# Patient Record
Sex: Male | Born: 1995
Health system: Southern US, Community
[De-identification: ages and names within clinical notes are randomized; demographics above are authoritative.]

---
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
## 2015-01-02 ENCOUNTER — Telehealth: Payer: Self-pay | Admitting: Family Medicine

## 2015-01-02 NOTE — Telephone Encounter (Signed)
appt scheduled

## 2015-01-03 ENCOUNTER — Ambulatory Visit (INDEPENDENT_AMBULATORY_CARE_PROVIDER_SITE_OTHER): Payer: No Typology Code available for payment source | Admitting: Family Medicine

## 2015-01-03 VITALS — BP 132/72 | HR 61 | Temp 97.5°F | Ht 76.6 in | Wt 275.0 lb

## 2015-01-03 DIAGNOSIS — L209 Atopic dermatitis, unspecified: Secondary | ICD-10-CM

## 2015-01-03 DIAGNOSIS — L739 Follicular disorder, unspecified: Secondary | ICD-10-CM

## 2015-01-03 MED ORDER — METHYLPREDNISOLONE ACETATE 80 MG/ML IJ SUSP
60.0000 mg | Freq: Once | INTRAMUSCULAR | Status: AC
  Administered 2015-01-03: 60 mg via INTRAMUSCULAR

## 2015-01-03 MED ORDER — DOXYCYCLINE HYCLATE 100 MG PO TABS: 100.0000 mg | ORAL_TABLET | Freq: Two times a day (BID) | ORAL | Status: DC

## 2015-01-03 MED ORDER — PREDNISONE 10 MG PO TABS: ORAL_TABLET | ORAL | Status: DC

## 2015-01-03 NOTE — Patient Instructions (Signed)
Take medications as directed Use scent free fabric softener soaps and detergents If worse or no better in a couple weeks please call back

## 2015-01-03 NOTE — Progress Notes (Signed)
   Subjective:    Patient ID: Travis Lyons, male    DOB: 1996-10-10, 19 y.o.   MRN: [id number]  HPI  There are no active problems to display for this patient.  Outpatient Encounter Prescriptions as of 01/03/2015  Medication Sig  . sertraline (ZOLOFT) 50 MG tablet Take 1 tablet (50 mg total) by mouth daily.  Marland Kitchen doxycycline (VIBRA-TABS) 100 MG tablet Take 1 tablet (100 mg total) by mouth 2 (two) times daily.  . predniSONE (DELTASONE) 10 MG tablet 1 tablet 4 times a day for 2 days,  1 tablet 3 times a day for 2 days,  1 tablet 2 times a day for 2 days, 1 tablet daily for 2 days  . [DISCONTINUED] amoxicillin (AMOXIL) 875 MG tablet Take 1 tablet (875 mg total) by mouth 2 (two) times daily.      Review of Systems     Objective:   Physical Exam   BP 132/72 mmHg  Pulse 61  Temp(Src) 97.5 F (36.4 C) (Oral)  Ht 6' 4.6" (1.946 m)  Wt 275 lb (124.739 kg)  BMI 32.94 kg/m2      Assessment & Plan:

## 2015-01-03 NOTE — Progress Notes (Signed)
   Subjective:    Patient ID: Travis Lyons, male    DOB: 06/30/1996, 19 y.o.   MRN: [id number]  HPI Agent comes in today with a rash that is worse on his back and is now getting worse on his arms. This is been going on for 3-4 months and since football season. It does not itch.   Review of Systems  Skin: Positive for rash. Negative for color change, pallor and wound.  All other systems reviewed and are negative.      Objective:   Physical Exam  Constitutional: He appears well-developed and well-nourished. No distress.  The patient comes to the visit today with his mom.  Skin: Skin is warm and dry. Rash noted. No erythema. No pallor.  This is a follicular type rash being worse on his backside and several areas on both arms and a few have tiny pustules in them.  Psychiatric: He has a normal mood and affect. His behavior is normal. Judgment and thought content normal.  Nursing note and vitals reviewed.         Assessment & Plan:  1. Folliculitis - doxycycline (VIBRA-TABS) 100 MG tablet; Take 1 tablet (100 mg total) by mouth 2 (two) times daily.  Dispense: 20 tablet; Refill: 0 - predniSONE (DELTASONE) 10 MG tablet; 1 tablet 4 times a day for 2 days,  1 tablet 3 times a day for 2 days,  1 tablet 2 times a day for 2 days, 1 tablet daily for 2 days  Dispense: 20 tablet; Refill: 0 - methylPREDNISolone acetate (DEPO-MEDROL) injection 60 mg; Inject 0.75 mLs (60 mg total) into the muscle once.  2. Atopic dermatitis  Patient Instructions  Take medications as directed Use scent free fabric softener soaps and detergents If worse or no better in a couple weeks please call back   Arrie Senate MD

## 2015-01-09 ENCOUNTER — Telehealth: Payer: Self-pay | Admitting: Family

## 2015-01-09 NOTE — Telephone Encounter (Signed)
Is it ok for patient to have note for school for today for stomach virus

## 2015-01-09 NOTE — Telephone Encounter (Signed)
Note made. Mother aware and sent

## 2015-01-09 NOTE — Telephone Encounter (Signed)
Please fax mom a note for school. Only for today!

## 2015-02-16 ENCOUNTER — Ambulatory Visit: Payer: No Typology Code available for payment source | Admitting: Family

## 2015-12-04 ENCOUNTER — Encounter: Payer: Self-pay | Admitting: Family Medicine

## 2015-12-04 ENCOUNTER — Telehealth: Payer: Self-pay | Admitting: Family Medicine

## 2015-12-04 ENCOUNTER — Ambulatory Visit (INDEPENDENT_AMBULATORY_CARE_PROVIDER_SITE_OTHER): Payer: BLUE CROSS/BLUE SHIELD | Admitting: Family Medicine

## 2015-12-04 VITALS — BP 140/79 | HR 80 | Temp 97.6°F | Ht 76.0 in | Wt 290.0 lb

## 2015-12-04 DIAGNOSIS — K529 Noninfective gastroenteritis and colitis, unspecified: Secondary | ICD-10-CM

## 2015-12-04 MED ORDER — ONDANSETRON 8 MG PO TBDP: 8.0000 mg | ORAL_TABLET | Freq: Four times a day (QID) | ORAL | Status: DC | PRN

## 2015-12-04 NOTE — Progress Notes (Signed)
Subjective:  Patient ID: KAIEA LEWEY, male    DOB: 09/30/1996  Age: 20 y.o. MRN: [id number]  CC: Nausea; Emesis; and Diarrhea   HPI TREYVONN EDGELL presents for Onset yesterday after lunch of nausea then vomiting. There were several episodes through the afternoon. Tried to eat, but vomitied   History Kage has no past medical history on file.   He has no past surgical history on file.   His family history is not on file.He reports that he has never smoked. His smokeless tobacco use includes Chew. He reports that he does not drink alcohol or use illicit drugs.    ROS Review of Systems  Constitutional: Negative for fever, chills, diaphoresis and unexpected weight change.  HENT: Negative for rhinorrhea and trouble swallowing.   Respiratory: Negative for cough, chest tightness and shortness of breath.   Cardiovascular: Negative for chest pain.  Gastrointestinal: Positive for nausea, vomiting, abdominal pain and diarrhea. Negative for constipation, blood in stool, abdominal distention and rectal pain.  Genitourinary: Negative for dysuria, hematuria and flank pain.  Musculoskeletal: Negative for joint swelling and arthralgias.  Skin: Negative for rash.  Neurological: Negative for syncope and headaches.    Objective:  BP 140/79 mmHg  Pulse 80  Temp(Src) 97.6 F (36.4 C) (Oral)  Ht 6\' 4"  (1.93 m)  Wt 290 lb (131.543 kg)  BMI 35.31 kg/m2  BP Readings from Last 3 Encounters:  12/04/15 140/79  01/03/15 132/72  07/24/14 125/67    Wt Readings from Last 3 Encounters:  12/04/15 290 lb (131.543 kg) (100 %*, Z = 2.94)  01/03/15 275 lb (124.739 kg) (100 %*, Z = 2.78)  07/24/14 263 lb (119.296 kg) (100 %*, Z = 2.67)   * Growth percentiles are based on CDC 2-20 Years data.     Physical Exam  Constitutional: He appears well-developed and well-nourished.  HENT:  Head: Normocephalic and atraumatic.  Right Ear: Tympanic membrane and external ear normal. No decreased hearing is  noted.  Left Ear: Tympanic membrane and external ear normal. No decreased hearing is noted.  Mouth/Throat: No oropharyngeal exudate or posterior oropharyngeal erythema.  Eyes: Pupils are equal, round, and reactive to light.  Neck: Normal range of motion. Neck supple.  Cardiovascular: Normal rate and regular rhythm.   No murmur heard. Pulmonary/Chest: Breath sounds normal. No respiratory distress.  Abdominal: Soft. Bowel sounds are normal. He exhibits no mass. There is no tenderness.  Vitals reviewed.    No results found for: WBC, HGB, HCT, PLT, GLUCOSE, CHOL, TRIG, HDL, LDLDIRECT, LDLCALC, ALT, AST, NA, K, CL, CREATININE, BUN, CO2, TSH, PSA, INR, GLUF, HGBA1C, MICROALBUR  No results found.  Assessment & Plan:   Chuong was seen today for nausea, emesis and diarrhea.  Diagnoses and all orders for this visit:  Gastroenteritis  Other orders -     ondansetron (ZOFRAN-ODT) 8 MG disintegrating tablet; Take 1 tablet (8 mg total) by mouth every 6 (six) hours as needed for nausea or vomiting.      Use OTC Immodium AD as needed for diarrhea  Clear liquids Today:  Clear Liquid Diet A clear liquid diet is a short-term diet that is prescribed to provide the necessary fluid and basic energy you need when you can have nothing else. The clear liquid diet consists of liquids or solids that will become liquid at room temperature. You should be able to see through the liquid. There are many reasons that you may be restricted to clear liquids, such as:  When you have a sudden-onset (acute) condition that occurs before or after surgery.  To help your body slowly get adjusted to food again after a long period when you were unable to have food.  Replacement of fluids when you have a diarrheal disease.  When you are going to have certain exams, such as a colonoscopy, in which instruments are inserted inside your body to look at parts of your digestive system. WHAT CAN I HAVE? A clear liquid diet  does not provide all the nutrients you need. It is important to choose a variety of the following items to get as many nutrients as possible:  Vegetable juices that do not have pulp.  Fruit juices and fruit drinks that do not have pulp.  Coffee (regular or decaffeinated), tea, or soda at the discretion of your health care provider.  Clear bouillon, broth, or strained broth-based soups.  High-protein and flavored gelatins.  Sugar or honey.  Ices or frozen ice pops that do not contain milk. If you are not sure whether you can have certain items, you should ask your health care provider. You may also ask your health care provider if there are any other clear liquid options.   Full Liquids Saturday:  Full Liquid Diet A full liquid diet may be used:   To help you transition from a clear liquid diet to a soft diet.   When your body is healing and can only tolerate foods that are easy to digest.  Before or after certain a procedure, test, or surgery (such as stomach or intestinal surgeries).   If you have trouble swallowing or chewing.  A full liquid diet includes fluids and foods that are liquid or will become liquid at room temperature. The full liquid diet gives you the proteins, fluids, salts, and minerals that you need for energy. If you continue this diet for more than 72 hours, talk to your health care provider about how many calories you need to consume. If you continue the diet for more than 5 days, talk to your health care provider about taking a multivitamin or a nutritional supplement. WHAT DO I NEED TO KNOW ABOUT A FULL LIQUID DIET?  You may have any liquid.  You may have any food that becomes a liquid at room temperature. The food is considered a liquid if it can be poured off a spoon at room temperature.  Drink one serving of citrus or vitamin C-enriched fruit juice daily. WHAT FOODS CAN I EAT? Grains Any grain food that can be pureed in soup (such as crackers,  pasta, and rice). Hot cereal (such as farina or oatmeal) that has been blended. Talk to your health care provider or dietitian about these foods. Vegetables Pulp-free tomato or vegetable juice. Vegetables pureed in soup.  Fruits Fruit juice, including nectars and juices with pulp. Meats and Other Protein Sources Eggs in custard, eggnog mix, and eggs used in ice cream or pudding. Strained meats, like in baby food, may be allowed. Consult your health care provider.  Dairy Milk and milk-based beverages, including milk shakes and instant breakfast mixes. Smooth yogurt. Pureed cottage cheese. Avoid these foods if they are not well tolerated. Beverages All beverages, including liquid nutritional supplements. Ask your health care provider if you can have carbonated beverages. They may not be well tolerated. Condiments Iodized salt, pepper, spices, and flavorings. Cocoa powder. Vinegar, ketchup, yellow mustard, smooth sauces (such as hollandaise, cheese sauce, or white sauce), and soy sauce. Sweets and Desserts Custard, smooth  pudding. Flavored gelatin. Tapioca, junket. Plain ice cream, sherbet, fruit ices. Frozen ice pops, frozen fudge pops, pudding pops, and other frozen bars with cream. Syrups, including chocolate syrup. Sugar, honey, jelly.  Fats and Oils Margarine, butter, cream, sour cream, and oils. Other Broth and cream soups. Strained, broth-based soups. The items listed above may not be a complete list of recommended foods or beverages. Contact your dietitian for more options.  WHAT FOODS CAN I NOT EAT? Grains All breads. Grains are not allowed unless they are pureed into soup. Vegetables Vegetables are not allowed unless they are juiced, or cooked and pureed into soup. Fruits Fruits are not allowed unless they are juiced. Meats and Other Protein Sources Any meat or fish. Cooked or raw eggs. Nut butters.  Dairy Cheese.  Condiments Stone ground mustards. Fats and Oils Fats that are  coarse or chunky. Sweets and Desserts Ice cream or other frozen desserts that have any solids in them or on top, such as nuts, chocolate chips, and pieces of cookies. Cakes. Cookies. Candy. Others Soups with chunks or pieces in them.  Normal diet starting Sunday, if symptoms are better    Follow-up: Return if symptoms worsen or fail to improve.  Claretta Fraise, M.D.

## 2015-12-04 NOTE — Patient Instructions (Addendum)
Use OTC Immodium AD as needed for diarrhea  Clear liquids Today:  Clear Liquid Diet A clear liquid diet is a short-term diet that is prescribed to provide the necessary fluid and basic energy you need when you can have nothing else. The clear liquid diet consists of liquids or solids that will become liquid at room temperature. You should be able to see through the liquid. There are many reasons that you may be restricted to clear liquids, such as:  When you have a sudden-onset (acute) condition that occurs before or after surgery.  To help your body slowly get adjusted to food again after a long period when you were unable to have food.  Replacement of fluids when you have a diarrheal disease.  When you are going to have certain exams, such as a colonoscopy, in which instruments are inserted inside your body to look at parts of your digestive system. WHAT CAN I HAVE? A clear liquid diet does not provide all the nutrients you need. It is important to choose a variety of the following items to get as many nutrients as possible:  Vegetable juices that do not have pulp.  Fruit juices and fruit drinks that do not have pulp.  Coffee (regular or decaffeinated), tea, or soda at the discretion of your health care provider.  Clear bouillon, broth, or strained broth-based soups.  High-protein and flavored gelatins.  Sugar or honey.  Ices or frozen ice pops that do not contain milk. If you are not sure whether you can have certain items, you should ask your health care provider. You may also ask your health care provider if there are any other clear liquid options.   Full Liquids Saturday:  Full Liquid Diet A full liquid diet may be used:   To help you transition from a clear liquid diet to a soft diet.   When your body is healing and can only tolerate foods that are easy to digest.  Before or after certain a procedure, test, or surgery (such as stomach or intestinal surgeries).    If you have trouble swallowing or chewing.  A full liquid diet includes fluids and foods that are liquid or will become liquid at room temperature. The full liquid diet gives you the proteins, fluids, salts, and minerals that you need for energy. If you continue this diet for more than 72 hours, talk to your health care provider about how many calories you need to consume. If you continue the diet for more than 5 days, talk to your health care provider about taking a multivitamin or a nutritional supplement. WHAT DO I NEED TO KNOW ABOUT A FULL LIQUID DIET?  You may have any liquid.  You may have any food that becomes a liquid at room temperature. The food is considered a liquid if it can be poured off a spoon at room temperature.  Drink one serving of citrus or vitamin C-enriched fruit juice daily. WHAT FOODS CAN I EAT? Grains Any grain food that can be pureed in soup (such as crackers, pasta, and rice). Hot cereal (such as farina or oatmeal) that has been blended. Talk to your health care provider or dietitian about these foods. Vegetables Pulp-free tomato or vegetable juice. Vegetables pureed in soup.  Fruits Fruit juice, including nectars and juices with pulp. Meats and Other Protein Sources Eggs in custard, eggnog mix, and eggs used in ice cream or pudding. Strained meats, like in baby food, may be allowed. Consult your health care provider.  Dairy Milk and milk-based beverages, including milk shakes and instant breakfast mixes. Smooth yogurt. Pureed cottage cheese. Avoid these foods if they are not well tolerated. Beverages All beverages, including liquid nutritional supplements. Ask your health care provider if you can have carbonated beverages. They may not be well tolerated. Condiments Iodized salt, pepper, spices, and flavorings. Cocoa powder. Vinegar, ketchup, yellow mustard, smooth sauces (such as hollandaise, cheese sauce, or white sauce), and soy sauce. Sweets and  Desserts Custard, smooth pudding. Flavored gelatin. Tapioca, junket. Plain ice cream, sherbet, fruit ices. Frozen ice pops, frozen fudge pops, pudding pops, and other frozen bars with cream. Syrups, including chocolate syrup. Sugar, honey, jelly.  Fats and Oils Margarine, butter, cream, sour cream, and oils. Other Broth and cream soups. Strained, broth-based soups. The items listed above may not be a complete list of recommended foods or beverages. Contact your dietitian for more options.  WHAT FOODS CAN I NOT EAT? Grains All breads. Grains are not allowed unless they are pureed into soup. Vegetables Vegetables are not allowed unless they are juiced, or cooked and pureed into soup. Fruits Fruits are not allowed unless they are juiced. Meats and Other Protein Sources Any meat or fish. Cooked or raw eggs. Nut butters.  Dairy Cheese.  Condiments Stone ground mustards. Fats and Oils Fats that are coarse or chunky. Sweets and Desserts Ice cream or other frozen desserts that have any solids in them or on top, such as nuts, chocolate chips, and pieces of cookies. Cakes. Cookies. Candy. Others Soups with chunks or pieces in them.  Normal diet starting Sunday, if symptoms are better

## 2015-12-04 NOTE — Telephone Encounter (Signed)
Patient just needed note refaxed.

## 2016-04-22 ENCOUNTER — Encounter: Payer: Self-pay | Admitting: Family Medicine

## 2016-04-22 ENCOUNTER — Ambulatory Visit (INDEPENDENT_AMBULATORY_CARE_PROVIDER_SITE_OTHER): Payer: BLUE CROSS/BLUE SHIELD | Admitting: Family Medicine

## 2016-04-22 VITALS — BP 130/81 | HR 81 | Temp 98.4°F | Ht 76.0 in | Wt 308.0 lb

## 2016-04-22 DIAGNOSIS — S39012A Strain of muscle, fascia and tendon of lower back, initial encounter: Secondary | ICD-10-CM

## 2016-04-22 MED ORDER — CYCLOBENZAPRINE HCL 10 MG PO TABS: 10.0000 mg | ORAL_TABLET | Freq: Three times a day (TID) | ORAL | Status: DC | PRN

## 2016-04-22 NOTE — Progress Notes (Signed)
BP 130/81 mmHg  Pulse 81  Temp(Src) 98.4 F (36.9 C) (Oral)  Ht 6\' 4"  (1.93 m)  Wt 308 lb (139.708 kg)  BMI 37.51 kg/m2   Subjective:    Patient ID: Travis Lyons, male    DOB: [birth year], 20 y.o.   MRN: [id number]  HPI: Travis Lyons is a 20 y.o. male presenting on 04/22/2016 for Back Pain   HPI Low back pain Patient comes in today with low back pain worse on the left than the right. This been going on for 2 weeks. He has been trying to use ibuprofen to deal with it. 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 He denies any numbness or weakness or loss of bowel or bladder. He denies any loss of range of motion. The pain is in a bandlike area across his low back and is described as an achy and tightness.  Relevant past medical, surgical, family and social history reviewed and updated as indicated. Interim medical history since our last visit reviewed. Allergies and medications reviewed and updated.  Review of Systems  Constitutional: Negative for fever.  HENT: Negative for ear discharge and ear pain.   Eyes: Negative for discharge and visual disturbance.  Respiratory: Negative for shortness of breath and wheezing.   Cardiovascular: Negative for chest pain and leg swelling.  Gastrointestinal: Negative for abdominal pain, diarrhea and constipation.  Genitourinary: Negative for difficulty urinating.  Musculoskeletal: Positive for myalgias and back pain. Negative for gait problem.  Skin: Negative for rash.  Neurological: Negative for dizziness, syncope, weakness, light-headedness, numbness and headaches.  All other systems reviewed and are negative.   Per HPI unless specifically indicated above     Medication List       This list is accurate as of: 04/22/16  3:43 PM.  Always use your most recent med list.               cyclobenzaprine 10 MG tablet  Commonly known as:  FLEXERIL  Take 1 tablet (10 mg total) by mouth 3 (three) times daily  as needed for muscle spasms.     ibuprofen 400 MG tablet  Commonly known as:  ADVIL,MOTRIN  Take 400 mg by mouth as needed.           Objective:    BP 130/81 mmHg  Pulse 81  Temp(Src) 98.4 F (36.9 C) (Oral)  Ht 6\' 4"  (1.93 m)  Wt 308 lb (139.708 kg)  BMI 37.51 kg/m2  Wt Readings from Last 3 Encounters:  04/22/16 308 lb (139.708 kg) (100 %*, Z = 3.15)  12/04/15 290 lb (131.543 kg) (100 %*, Z = 2.94)  01/03/15 275 lb (124.739 kg) (100 %*, Z = 2.78)   * Growth percentiles are based on CDC 2-20 Years data.    Physical Exam  Constitutional: He is oriented to person, place, and time. He appears well-developed and well-nourished. No distress.  Eyes: Conjunctivae and EOM are normal. Pupils are equal, round, and reactive to light. Right eye exhibits no discharge. No scleral icterus.  Neck: Neck supple. No thyromegaly present.  Cardiovascular: Normal rate, regular rhythm, normal heart sounds and intact distal pulses.   No murmur heard. Pulmonary/Chest: Effort normal and breath sounds normal. No respiratory distress. He has no wheezes.  Musculoskeletal: Normal range of motion. He exhibits tenderness (low back pain, worse on right than left, negative SLR b/l). He exhibits no edema.  Lymphadenopathy:    He  has no cervical adenopathy.  Neurological: He is alert and oriented to person, place, and time. Coordination normal.  Skin: Skin is warm and dry. No rash noted. He is not diaphoretic.  Psychiatric: He has a normal mood and affect. His behavior is normal.  Nursing note and vitals reviewed.     Assessment & Plan:   Problem List Items Addressed This Visit    None    Visit Diagnoses    Lumbar strain, initial encounter    -  Primary    Worse on left than right, no radicular symptoms, do anti-inflammatories and heat, stretches, tennis ball massage    Relevant Medications    cyclobenzaprine (FLEXERIL) 10 MG tablet        Follow up plan: Return if symptoms worsen or fail to  improve.  Counseling provided for all of the vaccine components No orders of the defined types were placed in this encounter.    Caryl Pina, MD [facility] 04/22/2016, 3:43 PM

## 2016-12-21 ENCOUNTER — Encounter: Payer: Self-pay | Admitting: Family Medicine

## 2016-12-21 ENCOUNTER — Ambulatory Visit (INDEPENDENT_AMBULATORY_CARE_PROVIDER_SITE_OTHER): Payer: BLUE CROSS/BLUE SHIELD | Admitting: Family Medicine

## 2016-12-21 VITALS — BP 140/82 | HR 78 | Temp 98.9°F | Ht 76.0 in | Wt 302.0 lb

## 2016-12-21 DIAGNOSIS — L239 Allergic contact dermatitis, unspecified cause: Secondary | ICD-10-CM | POA: Diagnosis not present

## 2016-12-21 MED ORDER — BETAMETHASONE SOD PHOS & ACET 6 (3-3) MG/ML IJ SUSP
6.0000 mg | Freq: Once | INTRAMUSCULAR | Status: AC
  Administered 2016-12-21: 6 mg via INTRAMUSCULAR

## 2016-12-21 NOTE — Progress Notes (Signed)
   Subjective:  Patient ID: Travis Lyons, male    DOB: Jan 21, 1996  Age: 21 y.o. MRN: [id number]  CC: Rash   HPI ARYAV STOECKEL presents for Onset yesterday of pruritic rash located at the arms and legs. Started actually at the left ankle. Very pruritic scratched at it quite a bit. Spreading all over his body today. No fever chills sweats or respiratory symptoms. No systemic signs such as dyspnea tongue or throat swelling jittery and anxious feeling palpitations or chest pain.  History Garvis has no past medical history on file.   He has no past surgical history on file.   His family history is not on file.He reports that he has never smoked. His smokeless tobacco use includes Chew. He reports that he does not drink alcohol or use drugs.  Current Outpatient Prescriptions on File Prior to Visit  Medication Sig Dispense Refill  . cyclobenzaprine (FLEXERIL) 10 MG tablet Take 1 tablet (10 mg total) by mouth 3 (three) times daily as needed for muscle spasms. 30 tablet 0  . ibuprofen (ADVIL,MOTRIN) 400 MG tablet Take 400 mg by mouth as needed.     No current facility-administered medications on file prior to visit.     ROS Review of Systems  Constitutional: Negative for chills, diaphoresis and fever.  HENT: Negative for rhinorrhea and sore throat.   Respiratory: Negative for cough and shortness of breath.   Cardiovascular: Negative for chest pain.  Gastrointestinal: Negative for abdominal pain.  Musculoskeletal: Negative for arthralgias and myalgias.  Skin: Positive for rash (see history of present illness).  Neurological: Negative for weakness and headaches.    Objective:  BP 140/82   Pulse 78   Temp 98.9 F (37.2 C) (Oral)   Ht 6\' 4"  (1.93 m)   Wt (!) 302 lb (137 kg)   BMI 36.76 kg/m   Physical Exam  Constitutional: He is oriented to person, place, and time. He appears well-developed and well-nourished.  HENT:  Head: Normocephalic and atraumatic.  Right Ear: External ear  normal.  Left Ear: External ear normal.  Mouth/Throat: No oropharyngeal exudate or posterior oropharyngeal erythema.  Eyes: Pupils are equal, round, and reactive to light.  Neck: Normal range of motion. Neck supple.  Cardiovascular: Normal rate and regular rhythm.   No murmur heard. Pulmonary/Chest: Breath sounds normal. No respiratory distress.  Neurological: He is alert and oriented to person, place, and time.  Skin: Rash noted. There is erythema.  Maculopapular eruption covering much of the dorsal forearms. Goes up to the shoulder. None on the back or head. Also at the lower left leg and ankle and onto the dorsal foot. The palms and soles are spared. Genitalia spared.  Psychiatric: He has a normal mood and affect.  Vitals reviewed.   Assessment & Plan:   Esteven was seen today for rash.  Diagnoses and all orders for this visit:  Eczema, allergic -     betamethasone acetate-betamethasone sodium phosphate (CELESTONE) injection 6 mg; Inject 1 mL (6 mg total) into the muscle once.   I am having Mr. Slaski maintain his ibuprofen and cyclobenzaprine. We will continue to administer betamethasone acetate-betamethasone sodium phosphate.  Meds ordered this encounter  Medications  . betamethasone acetate-betamethasone sodium phosphate (CELESTONE) injection 6 mg     Follow-up: No Follow-up on file.  Claretta Fraise, M.D.

## 2018-11-03 ENCOUNTER — Emergency Department (HOSPITAL_COMMUNITY)
Admission: EM | Admit: 2018-11-03 | Discharge: 2018-11-04 | Disposition: A | Discharge status: Home / Self Care | Payer: BLUE CROSS/BLUE SHIELD | Attending: Emergency Medicine | Admitting: Emergency Medicine

## 2018-11-03 ENCOUNTER — Other Ambulatory Visit: Payer: Self-pay

## 2018-11-03 ENCOUNTER — Encounter (HOSPITAL_COMMUNITY): Payer: Self-pay | Admitting: Emergency Medicine

## 2018-11-03 DIAGNOSIS — R072 Precordial pain: Secondary | ICD-10-CM | POA: Insufficient documentation

## 2018-11-03 DIAGNOSIS — F17220 Nicotine dependence, chewing tobacco, uncomplicated: Secondary | ICD-10-CM | POA: Insufficient documentation

## 2018-11-03 DIAGNOSIS — R079 Chest pain, unspecified: Secondary | ICD-10-CM | POA: Diagnosis present

## 2018-11-03 DIAGNOSIS — R091 Pleurisy: Secondary | ICD-10-CM | POA: Diagnosis not present

## 2018-11-03 NOTE — ED Triage Notes (Signed)
Pt woke up with chest pain that he rated 5/10. States pain started in his back and radiated around to L. Side of chest. Pt describes pain as sharp in nature and worse with inspiration. EMS gave pt 324 ASA and 1sl NTG en route. Pt now rates pain as 4/10.

## 2018-11-04 ENCOUNTER — Other Ambulatory Visit: Payer: Self-pay

## 2018-11-04 ENCOUNTER — Emergency Department (HOSPITAL_COMMUNITY): Payer: BLUE CROSS/BLUE SHIELD

## 2018-11-04 LAB — CBC WITH DIFFERENTIAL/PLATELET
ABS IMMATURE GRANULOCYTES: 0.04 10*3/uL (ref 0.00–0.07)
BASOS ABS: 0.1 10*3/uL (ref 0.0–0.1)
BASOS PCT: 1 %
Eosinophils Absolute: 0.4 10*3/uL (ref 0.0–0.5)
Eosinophils Relative: 3 %
HCT: 43.2 % (ref 39.0–52.0)
Hemoglobin: 14 g/dL (ref 13.0–17.0)
IMMATURE GRANULOCYTES: 0 %
LYMPHS ABS: 3 10*3/uL (ref 0.7–4.0)
Lymphocytes Relative: 25 %
MCH: 28.9 pg (ref 26.0–34.0)
MCHC: 32.4 g/dL (ref 30.0–36.0)
MCV: 89.1 fL (ref 80.0–100.0)
MONOS PCT: 9 %
Monocytes Absolute: 1.1 10*3/uL — ABNORMAL HIGH (ref 0.1–1.0)
NEUTROS ABS: 7.4 10*3/uL (ref 1.7–7.7)
NRBC: 0 % (ref 0.0–0.2)
Neutrophils Relative %: 62 %
PLATELETS: 290 10*3/uL (ref 150–400)
RBC: 4.85 MIL/uL (ref 4.22–5.81)
RDW: 12.9 % (ref 11.5–15.5)
WBC: 12 10*3/uL — ABNORMAL HIGH (ref 4.0–10.5)

## 2018-11-04 LAB — BASIC METABOLIC PANEL
ANION GAP: 8 (ref 5–15)
BUN: 20 mg/dL (ref 6–20)
CO2: 25 mmol/L (ref 22–32)
Calcium: 9.4 mg/dL (ref 8.9–10.3)
Chloride: 107 mmol/L (ref 98–111)
Creatinine, Ser: 1.09 mg/dL (ref 0.61–1.24)
GFR calc Af Amer: >60 mL/min (ref >60)
GFR calc non Af Amer: >60 mL/min (ref >60)
GLUCOSE: 109 mg/dL — AB (ref 70–99)
POTASSIUM: 3.6 mmol/L (ref 3.5–5.1)
Sodium: 140 mmol/L (ref 135–145)

## 2018-11-04 LAB — D-DIMER, QUANTITATIVE: D-Dimer, Quant: 0.29 ug/mL-FEU (ref 0.00–0.50)

## 2018-11-04 NOTE — Discharge Instructions (Signed)

## 2018-11-04 NOTE — ED Provider Notes (Signed)
[facility] EMERGENCY DEPARTMENT Provider Note   CSN: [id number] Arrival date & time: 11/03/18  2350     History   Chief Complaint Chief Complaint  Patient presents with  . Chest Pain    HPI Travis Lyons is a 22 y.o. male.  The history is provided by the patient.  Chest Pain   This is a new problem. The current episode started 6 to 12 hours ago. The problem occurs hourly. The problem has been gradually worsening. The pain is associated with breathing. The pain is present in the lateral region. The pain is moderate. The quality of the pain is described as sharp. The pain radiates to the upper back. The symptoms are aggravated by deep breathing. Pertinent negatives include no abdominal pain, no cough, no fever, no hemoptysis, no shortness of breath and no vomiting. He has tried nitroglycerin (ASA) for the symptoms. The treatment provided moderate relief.  Patient reports since the morning he is having left-sided chest pain.  It seems to go from his chest to his back with deep breathing.  He has no other significant symptoms.  No recent illnesses in the past 1 to 2 weeks.  He does report recent stress and  death of a friend over the holidays    PMH-none Soc hx - smokeless tobacco vaped yesterday (tobacco) No drug use No travel Home Medications    Prior to Admission medications   Medication Sig Start Date End Date Taking? Authorizing Provider  ibuprofen (ADVIL,MOTRIN) 400 MG tablet Take 400 mg by mouth as needed.   Yes [provider]  cyclobenzaprine (FLEXERIL) 10 MG tablet Take 1 tablet (10 mg total) by mouth 3 (three) times daily as needed for muscle spasms. 04/22/16   Dettinger, Fransisca Kaufmann, MD    Family History No family history on file.  Social History Social History   Tobacco Use  . Smoking status: Never Smoker  . Smokeless tobacco: Current User    Types: Chew  Substance Use Topics  . Alcohol use: No  . Drug use: No     Allergies   Cefzil  [cefprozil]   Review of Systems Review of Systems  Constitutional: Negative for fever.  Respiratory: Negative for cough, hemoptysis and shortness of breath.   Cardiovascular: Positive for chest pain.  Gastrointestinal: Negative for abdominal pain and vomiting.  All other systems reviewed and are negative.    Physical Exam Updated Vital Signs BP (!) 143/70 (BP Location: Right Arm)   Pulse 81   Temp 98.4 F (36.9 C) (Oral)   Resp 13   SpO2 96%   Physical Exam CONSTITUTIONAL: Well developed/well nourished HEAD: Normocephalic/atraumatic EYES: EOMI/PERRL ENMT: Mucous membranes moist NECK: supple no meningeal signs SPINE/BACK:entire spine nontender CV: S1/S2 noted, no murmurs/rubs/gallops noted LUNGS: Lungs are clear to auscultation bilaterally, no apparent distress Chest-no tenderness or bruising ABDOMEN: soft, nontender, no rebound or guarding, bowel sounds noted throughout abdomen GU:no cva tenderness NEURO: Pt is awake/alert/appropriate, moves all extremitiesx4.  No facial droop.   EXTREMITIES: pulses normal/equal, full ROM, no calf tenderness SKIN: warm, color normal PSYCH: no abnormalities of mood noted, alert and oriented to situation   ED Treatments / Results  Labs (all labs ordered are listed, but only abnormal results are displayed) Labs Reviewed  BASIC METABOLIC PANEL - Abnormal; Notable for the following components:      Result Value   Glucose, Bld 109 (*)    All other components within normal limits  CBC WITH DIFFERENTIAL/PLATELET - Abnormal; Notable  for the following components:   WBC 12.0 (*)    Monocytes Absolute 1.1 (*)    All other components within normal limits  D-DIMER, QUANTITATIVE (NOT AT [facility])    EKG EKG Interpretation  Date/Time:  Saturday November 03 2018 23:54:35 EST Ventricular Rate:  105 PR Interval:    QRS Duration: 88 QT Interval:  320 QTC Calculation: 423 R Axis:   72 Text Interpretation:  Sinus tachycardia Nonspecific T  abnormalities, lateral leads No previous ECGs available Confirmed by Ripley Fraise [phone]) on 11/03/2018 11:59:04 PM   EKG Interpretation  Date/Time:  Sunday November 04 2018 01:14:42 EST Ventricular Rate:  90 PR Interval:    QRS Duration: 91 QT Interval:  341 QTC Calculation: 418 R Axis:   65 Text Interpretation:  Sinus rhythm Borderline T wave abnormalities Borderline ST elevation, anterior leads Confirmed by Ripley Fraise [phone]) on 11/04/2018 1:23:04 AM       Radiology Dg Chest 2 View  Result Date: 11/04/2018 CLINICAL DATA:  Acute onset of intermittent midsternal chest pain. EXAM: CHEST - 2 VIEW COMPARISON:  None. FINDINGS: The lungs are well-aerated and clear. There is no evidence of focal opacification, pleural effusion or pneumothorax. The heart is normal in size; the mediastinal contour is within normal limits. No acute osseous abnormalities are seen. IMPRESSION: No acute cardiopulmonary process seen. Electronically Signed   By: Garald Balding M.D.   On: 11/04/2018 01:01    Procedures Procedures  Medications Ordered in ED Medications - No data to display   Initial Impression / Assessment and Plan / ED Course  I have reviewed the triage vital signs and the nursing notes.  Pertinent labs & imaging results that were available during my care of the patient were reviewed by me and considered in my medical decision making (see chart for details).     Patient presents with onset of pleuritic type chest pain for several hours.  He is now improved.  No hypoxia noted, chest x-ray is negative.  Labs are reassuring. PE has been ruled out.  Differential includes pericarditis versus pleurisy, though no strong EKG findings to support pericarditis.  Patient is very well-appearing, smiling and in no acute distress. My suspicion for acute cardiopulmonary emergency is very low.  We discussed return precautions.  Also advised use anti-inflammatories.  Needs PCP follow-up in 1 week if  no improvement Discussed care with patient and his mother with his permission, patient agreeable plan Final Clinical Impressions(s) / ED Diagnoses   Final diagnoses:  Precordial pain  Pleurisy    ED Discharge Orders    None       Ripley Fraise, MD 11/04/18 [phone]

## 2018-12-12 ENCOUNTER — Encounter: Payer: Self-pay | Admitting: Physician Assistant

## 2018-12-12 ENCOUNTER — Other Ambulatory Visit: Payer: Self-pay

## 2018-12-12 ENCOUNTER — Ambulatory Visit (INDEPENDENT_AMBULATORY_CARE_PROVIDER_SITE_OTHER): Payer: No Typology Code available for payment source | Admitting: Physician Assistant

## 2018-12-12 VITALS — BP 123/76 | HR 79 | Temp 97.8°F | Ht 77.0 in | Wt 323.0 lb

## 2018-12-12 DIAGNOSIS — D229 Melanocytic nevi, unspecified: Secondary | ICD-10-CM

## 2018-12-12 NOTE — Progress Notes (Signed)
BP 123/76   Pulse 79   Temp 97.8 F (36.6 C) (Oral)   Ht 6\' 5"  (1.956 m)   Wt (!) 323 lb (146.5 kg)   BMI 38.30 kg/m    Subjective:    Patient ID: Travis Lyons, male    DOB: 03-30-1996, 23 y.o.   MRN: [id number]  HPI: Travis Lyons is a 23 y.o. male presenting on 12/12/2018 for Nevus (on head)  This patient comes in for an abnormal nevus on the left scalp.  Over the past 2 months when he is got his haircut it has gotten hit and will bleed for a little bit.  He can get it under control.  It does feel sore to the touch.  States he has had it for some time but it just has grown significantly in the last few months.  He denies any fever or chills.  He does not have any other lesions like this on his body.  History reviewed. No pertinent past medical history. Relevant past medical, surgical, family and social history reviewed and updated as indicated. Interim medical history since our last visit reviewed. Allergies and medications reviewed and updated. DATA REVIEWED: CHART IN EPIC  Family History reviewed for pertinent findings.  Review of Systems  Constitutional: Negative.  Negative for appetite change and fatigue.  Eyes: Negative for pain and visual disturbance.  Respiratory: Negative.  Negative for cough, chest tightness, shortness of breath and wheezing.   Cardiovascular: Negative.  Negative for chest pain, palpitations and leg swelling.  Gastrointestinal: Negative.  Negative for abdominal pain, diarrhea, nausea and vomiting.  Genitourinary: Negative.   Skin: Negative.  Negative for color change and rash.  Neurological: Negative.  Negative for weakness, numbness and headaches.  Psychiatric/Behavioral: Negative.     Allergies as of 12/12/2018      Reactions   Cefzil [cefprozil]       Medication List       Accurate as of December 12, 2018 11:44 AM. Always use your most recent med list.        ibuprofen 400 MG tablet Commonly known as:  ADVIL,MOTRIN Take 400 mg by mouth as  needed.          Objective:    BP 123/76   Pulse 79   Temp 97.8 F (36.6 C) (Oral)   Ht 6\' 5"  (1.956 m)   Wt (!) 323 lb (146.5 kg)   BMI 38.30 kg/m   Allergies  Allergen Reactions  . Cefzil [Cefprozil]     Wt Readings from Last 3 Encounters:  12/12/18 (!) 323 lb (146.5 kg)  11/04/18 300 lb (136.1 kg)  12/21/16 (!) 302 lb (137 kg)    Physical Exam Vitals signs and nursing note reviewed.  Constitutional:      General: He is not in acute distress.    Appearance: He is well-developed.  HENT:     Head: Normocephalic and atraumatic.      Comments: Skin color raised papule, hair growth, slightly rough Eyes:     Conjunctiva/sclera: Conjunctivae normal.     Pupils: Pupils are equal, round, and reactive to light.  Cardiovascular:     Rate and Rhythm: Normal rate and regular rhythm.     Heart sounds: Normal heart sounds.  Pulmonary:     Effort: Pulmonary effort is normal. No respiratory distress.     Breath sounds: Normal breath sounds.  Skin:    General: Skin is warm and dry.  Psychiatric:  Behavior: Behavior normal.         Assessment & Plan:   1. Nevus of scalp irritated Dermatology appointment this afternoon [employer]. Prior auth needed  Continue all other maintenance medications as listed above.  Follow up plan: No follow-ups on file.  Educational handout given for [place] PA-C [facility] [address]  [place], [place] [address] [phone]   12/12/2018, 11:44 AM

## 2018-12-17 ENCOUNTER — Ambulatory Visit: Payer: BLUE CROSS/BLUE SHIELD | Admitting: Family Medicine

## 2018-12-26 ENCOUNTER — Ambulatory Visit: Payer: BLUE CROSS/BLUE SHIELD | Admitting: Physician Assistant

## 2019-01-01 ENCOUNTER — Ambulatory Visit (INDEPENDENT_AMBULATORY_CARE_PROVIDER_SITE_OTHER): Payer: No Typology Code available for payment source | Admitting: Family Medicine

## 2019-01-01 ENCOUNTER — Encounter: Payer: Self-pay | Admitting: Family Medicine

## 2019-01-01 VITALS — BP 154/90 | HR 97 | Temp 99.0°F | Ht 77.0 in | Wt 318.0 lb

## 2019-01-01 DIAGNOSIS — J351 Hypertrophy of tonsils: Secondary | ICD-10-CM

## 2019-01-01 DIAGNOSIS — J029 Acute pharyngitis, unspecified: Secondary | ICD-10-CM | POA: Diagnosis not present

## 2019-01-01 LAB — RAPID STREP SCREEN (MED CTR MEBANE ONLY): Strep Gp A Ag, IA W/Reflex: NEGATIVE

## 2019-01-01 LAB — CULTURE, GROUP A STREP

## 2019-01-01 MED ORDER — PREDNISONE 20 MG PO TABS: 20.0000 mg | ORAL_TABLET | Freq: Every day | ORAL | 0 refills | Status: AC

## 2019-01-01 NOTE — Progress Notes (Signed)
    Travis Lyons is a 23 y.o. male presenting with a sore throat for 2 days.  Associated symptoms include:  chills.  Symptoms are constant.  Home treatment thus far includes:  lozenges and OTC sore throat / cold products. No relief with treatments.   No known sick contacts with similar symptoms.  There is no history of of similar symptoms.  History, medications, and allergies reviewed.   Review of Systems  Constitutional: Positive for chills and fever. Negative for malaise/fatigue.  HENT: Positive for sore throat.   Respiratory: Negative for cough, hemoptysis, sputum production, shortness of breath and wheezing.   Cardiovascular: Negative for chest pain and palpitations.  Gastrointestinal: Negative for abdominal pain, nausea and vomiting.  Neurological: Negative for dizziness and headaches.  All other systems reviewed and are negative.    Exam:  BP (!) 154/90   Pulse 97   Temp 99 F (37.2 C) (Oral)   Ht 6\' 5"  (1.956 m)   Wt (!) 318 lb (144.2 kg)   BMI 37.71 kg/m  Physical Exam  Constitutional: He is oriented to person, place, and time. He appears not dehydrated. He appears healthy.  Non-toxic appearance. He does not have a sickly appearance. He appears distressed (mild).  HENT:  Head: Normocephalic and atraumatic.  Mouth/Throat: Uvula is midline and mucous membranes are normal. Oropharyngeal exudate, posterior oropharyngeal edema and posterior oropharyngeal erythema present. No tonsillar abscesses.  Bilateral tonsils 3+  Eyes: Pupils are equal, round, and reactive to light. Conjunctivae are normal.  Neck: Normal range of motion and phonation normal. Neck supple.  Cardiovascular: Normal rate, regular rhythm and normal heart sounds.  Abdominal: Soft. Bowel sounds are normal. He exhibits no distension. There is no hepatosplenomegaly. There is no abdominal tenderness. There is no CVA tenderness.  Lymphadenopathy:    He has no cervical adenopathy.  Neurological: He is alert  and oriented to person, place, and time.  Skin: Skin is warm and dry. No rash noted. No erythema. No pallor.  Psychiatric: Mood, memory, affect and judgment normal.   Rapid strep negative in office  Assessment and Plan  Travis Lyons was seen today for sore throat.  Diagnoses and all orders for this visit:  Sore throat Rapid strep negative. Labs pending. Symptomatic care discussed.  -     Rapid Strep Screen ([facility] ONLY) -     Mononucleosis Test, Qual W/ Reflex  Tonsillar hypertrophy Symptomatic care discussed. Medications as prescribed. Report any new or worsening symptoms.  -     predniSONE (DELTASONE) 20 MG tablet; Take 1 tablet (20 mg total) by mouth daily with breakfast for 5 days.  Return if symptoms worsen or fail to improve.  The above assessment and management plan was discussed with the patient. The patient verbalized understanding of and has agreed to the management plan. Patient is aware to call the clinic if symptoms fail to improve or worsen. Patient is aware when to return to the clinic for a follow-up visit. Patient educated on when it is appropriate to go to the emergency department.   Monia Pouch, FNP-C [facility] [address] [place], [place] [address] [phone]

## 2019-01-01 NOTE — Patient Instructions (Signed)
Sore Throat  When you have a sore throat, your throat may feel:  · Tender.  · Burning.  · Irritated.  · Scratchy.  · Painful when you swallow.  · Painful when you talk.  Many things can cause a sore throat, such as:  · An infection.  · Allergies.  · Dry air.  · Smoke or pollution.  · Radiation treatment.  · Gastroesophageal reflux disease (GERD).  · A tumor.  A sore throat can be the first sign of another sickness. It can happen with other problems, like:  · Coughing.  · Sneezing.  · Fever.  · Swelling in the neck.  Most sore throats go away without treatment.  Follow these instructions at home:         · Take over-the-counter medicines only as told by your doctor.  ? If your child has a sore throat, do not give your child aspirin.  · Drink enough fluids to keep your pee (urine) pale yellow.  · Rest when you feel you need to.  · To help with pain:  ? Sip warm liquids, such as broth, herbal tea, or warm water.  ? Eat or drink cold or frozen liquids, such as frozen ice pops.  ? Gargle with a salt-water mixture 3-4 times a day or as needed. To make a salt-water mixture, add ½-1 tsp (3-6 g) of salt to 1 cup (237 mL) of warm water. Mix it until you cannot see the salt anymore.  ? Suck on hard candy or throat lozenges.  ? Put a cool-mist humidifier in your bedroom at night.  ? Sit in the bathroom with the door closed for 5-10 minutes while you run hot water in the shower.  · Do not use any products that contain nicotine or tobacco, such as cigarettes, e-cigarettes, and chewing tobacco. If you need help quitting, ask your doctor.  · Wash your hands well and often with soap and water. If soap and water are not available, use hand sanitizer.  Contact a doctor if:  · You have a fever for more than 2-3 days.  · You keep having symptoms for more than 2-3 days.  · Your throat does not get better in 7 days.  · You have a fever and your symptoms suddenly get worse.  · Your child who is 3 months to 3 years old has a temperature of  102.2°F (39°C) or higher.  Get help right away if:  · You have trouble breathing.  · You cannot swallow fluids, soft foods, or your saliva.  · You have swelling in your throat or neck that gets worse.  · You keep feeling sick to your stomach (nauseous).  · You keep throwing up (vomiting).  Summary  · A sore throat is pain, burning, irritation, or scratchiness in the throat. Many things can cause a sore throat.  · Take over-the-counter medicines only as told by your doctor. Do not give your child aspirin.  · Drink plenty of fluids, and rest as needed.  · Contact a doctor if your symptoms get worse or your sore throat does not get better within 7 days.  This information is not intended to replace advice given to you by your health care provider. Make sure you discuss any questions you have with your health care provider.  Document Released: 08/02/2008 Document Revised: 03/26/2018 Document Reviewed: 03/26/2018  Elsevier Interactive Patient Education © 2019 [employer]

## 2019-01-02 LAB — MONO QUAL W/RFLX QN: MONO QUAL W/RFLX QN: NEGATIVE

## 2019-11-19 ENCOUNTER — Encounter: Payer: Self-pay | Admitting: Family

## 2019-11-19 ENCOUNTER — Other Ambulatory Visit: Payer: Self-pay

## 2019-11-19 ENCOUNTER — Ambulatory Visit: Payer: BC Managed Care – PPO | Admitting: Family

## 2019-11-19 VITALS — BP 145/95 | HR 102 | Temp 99.1°F | Ht 77.0 in | Wt 310.6 lb

## 2019-11-19 DIAGNOSIS — Z202 Contact with and (suspected) exposure to infections with a predominantly sexual mode of transmission: Secondary | ICD-10-CM | POA: Diagnosis not present

## 2019-11-19 DIAGNOSIS — R300 Dysuria: Secondary | ICD-10-CM | POA: Diagnosis not present

## 2019-11-19 LAB — URINALYSIS, COMPLETE
Bilirubin, UA: NEGATIVE
Glucose, UA: NEGATIVE
Ketones, UA: NEGATIVE
Leukocytes,UA: NEGATIVE
Nitrite, UA: NEGATIVE
Protein,UA: NEGATIVE
RBC, UA: NEGATIVE
Specific Gravity, UA: 1.025 (ref 1.005–1.030)
Urobilinogen, Ur: 0.2 mg/dL (ref 0.2–1.0)
pH, UA: 7 (ref 5.0–7.5)

## 2019-11-19 LAB — MICROSCOPIC EXAMINATION
Bacteria, UA: NONE SEEN
Epithelial Cells (non renal): NONE SEEN /hpf (ref 0–10)
Renal Epithel, UA: NONE SEEN /hpf
WBC, UA: NONE SEEN /hpf (ref 0–5)

## 2019-11-19 MED ORDER — CEFTRIAXONE SODIUM 1 G IJ SOLR: 500.0000 mg | Freq: Once | INTRAMUSCULAR | Status: DC

## 2019-11-19 MED ORDER — CEFTRIAXONE SODIUM 500 MG IJ SOLR
500.0000 mg | Freq: Once | INTRAMUSCULAR | Status: AC
  Administered 2019-11-19: 15:00:00 500 mg via INTRAMUSCULAR

## 2019-11-19 MED ORDER — AZITHROMYCIN 500 MG PO TABS: 1000.0000 mg | ORAL_TABLET | Freq: Every day | ORAL | 0 refills | Status: DC

## 2019-11-19 NOTE — Patient Instructions (Signed)
Chlamydia, Male    Chlamydia is an STD (sexually transmitted disease). It is a bacterial infection that spreads through sexual contact (is contagious). Chlamydia can occur in different areas of the body, including the tube that moves urine from the bladder out of the body (urethra), the throat, or the rectum. This condition is not difficult to treat. However, if left untreated, chlamydia can lead to more serious health problems.  What are the causes?  Chlamydia is caused by the bacteria Chlamydia trachomatis. It is passed from an infected partner during sexual activity. Chlamydia can spread through contact with the genitals, mouth, or rectum.  What are the signs or symptoms?  In some cases, there may not be any symptoms for this condition (asymptomatic), especially early in the infection. If symptoms develop, they may include:  · Burning when urinating.  · Urinating frequently.  · Pain or swelling in the testicles.  · Watery, mucus-like discharge from the penis.  · Redness, soreness, and swelling (inflammation) of the rectum.  · Bleeding or discharge from the rectum.  · Abdominal pain.  · Itching, burning, or redness in the eyes, or discharge from the eyes.  How is this diagnosed?  This condition may be diagnosed based on:  · Urine tests.  · Swab tests. Depending on your symptoms, your health care provider may use a cotton swab to collect discharge from your urethra or rectum to test for the bacteria.  How is this treated?  This condition is treated with antibiotic medicines.  Follow these instructions at home:  Medicines  · Take over-the-counter and prescription medicines only as told by your health care provider.  · Take your antibiotic medicine as told by your health care provider. Do not stop taking the antibiotic even if you start to feel better.  Sexual activity  · Tell sexual partners about your infection. This includes any oral, anal, or vaginal sex partners you have had within 60 days of when your symptoms  started. Sexual partners should also be treated, even if they have no signs of the disease.  · Do not have sex until you and your sexual partners have completed treatment and your health care provider says it is okay. If your health care provider prescribed you a single dose treatment, wait 7 days after taking the treatment before having sex.  General instructions  · It is your responsibility to get your test results. Ask your health care provider, or the department performing the test, when your results will be ready.  · Get plenty of rest.  · Eat a healthy, well-balanced diet.  · Drink enough fluids to keep your urine clear or pale yellow.  · Keep all follow-up visits as told by your health care provider. This is important. You may need to be tested for infection again 3 months after treatment.  How is this prevented?  The only sure way to prevent chlamydia is to avoid sexual intercourse. However, you can lower your risk by:  · Using latex condoms correctly every time you have sexual intercourse.  · Not having multiple sexual partners.  · Asking if your sexual partner has been tested for STIs and had negative results.  Contact a health care provider if:  · You develop new symptoms or your symptoms do not get better after completing treatment.  · You have a fever or chills.  · You have pain during sexual intercourse.  · You develop new joint pain or swelling near your joints.  · You   Chlamydia is an STD (sexually transmitted disease). It is a bacterial infection that spreads (is contagious) through sexual contact.  This condition is not difficult to treat, however, if left untreated, it can lead to more serious health problems.  In some cases, there may not  be any symptoms for this condition (asymptomatic).  This condition is treated with antibiotic medicines.  Using latex condoms correctly every time you have sexual intercourse can help prevent chlamydia. This information is not intended to replace advice given to you by your health care provider. Make sure you discuss any questions you have with your health care provider. Document Revised: 11/08/2017 Document Reviewed: 10/10/2016 Elsevier Patient Education  [place] [facility] Practicing safe sex means taking steps before and during sex to reduce your risk of:  Getting an STI (sexually transmitted infection).  Giving your partner an STI.  Unwanted or unplanned pregnancy. How can I practice safe sex?     Ways you can practice safe sex  Limit your sexual partners to only one partner who is having sex with only you.  Avoid using alcohol and drugs before having sex. Alcohol and drugs can affect your judgment.  Before having sex with a new partner: ? Talk to your partner about past partners, past STIs, and drug use. ? Get screened for STIs and discuss the results with your partner. Ask your partner to get screened, too.  Check your body regularly for sores, blisters, rashes, or unusual discharge. If you notice any of these problems, visit your health care provider.  Avoid sexual contact if you have symptoms of an infection or you are being treated for an STI.  While having sex, use a condom. Make sure to: ? Use a condom every time you have vaginal, oral, or anal sex. Both females and males should wear condoms during oral sex. ? Keep condoms in place from the beginning to the end of sexual activity. ? Use a latex condom, if possible. Latex condoms offer the best protection. ? Use only water-based lubricants with a condom. Using petroleum-based lubricants or oils will weaken the condom and increase the chance that it will break. Ways your health care provider can help you  practice safe sex  See your health care provider for regular screenings, exams, and tests for STIs.  Talk with your health care provider about what kind of birth control (contraception) is best for you.  Get vaccinated against hepatitis B and human papillomavirus (HPV).  If you are at risk of being infected with HIV (human immunodeficiency virus), talk with your health care provider about taking a prescription medicine to prevent HIV infection. You are at risk for HIV if you: ? Are a man who has sex with other men. ? Are sexually active with more than one partner. ? Take drugs by injection. ? Have a sex partner who has HIV. ? Have unprotected sex. ? Have sex with someone who has sex with both men and women. ? Have had an STI. Follow these instructions at home:  Take over-the-counter and prescription medicines as told by your health care provider.  Keep all follow-up visits as told by your health care provider. This is important. Where to find more information  Centers for Disease Control and Prevention: PinkCheek.be  Planned Parenthood: https://www.plannedparenthood.org/  Office on Women's Health: BasketballVoice.it Summary  Practicing safe sex means taking steps before and during sex to reduce your risk of STIs, giving your partner STIs, and having an unwanted or unplanned pregnancy.  Before having sex with a new partner, talk to your partner about past partners, past STIs, and drug use.  Use a condom every time you have vaginal, oral, or anal sex. Both females and males should wear condoms during oral sex.  Check your body regularly for sores, blisters, rashes, or unusual discharge. If you notice any of these problems, visit your health care provider.  See your health care provider for regular screenings, exams, and tests for STIs. This information is not intended to replace advice given to  you by your health care provider. Make sure you discuss any questions you have with your health care provider. Document Revised: 02/15/2019 Document Reviewed: 08/06/2018 Elsevier Patient Education  [place].

## 2019-11-19 NOTE — Addendum Note (Signed)
Addended by: Karle Plumber on: 11/19/2019 03:13 PM   Modules accepted: Orders

## 2019-11-19 NOTE — Progress Notes (Signed)
Subjective:    Patient ID: Travis Lyons, male    DOB: Sep 16, 1996, 24 y.o.   MRN: [id number]  Chief Complaint  Patient presents with  . std check  . Dysuria    x 1 week   Pt presents to the office today with dysuria that started about a week. He does report a new sex parnter about two weeks ago.  Dysuria  This is a new problem. The current episode started 1 to 4 weeks ago. The problem occurs every urination. The problem has been gradually improving. The quality of the pain is described as burning. The pain is at a severity of 5/10. The pain is mild. There has been no fever. He is sexually active. Associated symptoms include a discharge (clear discharge 3-4 days ago, but not better. ) and frequency. Pertinent negatives include no flank pain, hematuria, hesitancy, nausea, urgency or vomiting. He has tried nothing for the symptoms. The treatment provided no relief.      Review of Systems  Gastrointestinal: Negative for nausea and vomiting.  Genitourinary: Positive for dysuria and frequency. Negative for flank pain, hematuria, hesitancy and urgency.  All other systems reviewed and are negative.      Objective:   Physical Exam Vitals reviewed.  Constitutional:      General: He is not in acute distress.    Appearance: He is well-developed.  HENT:     Head: Normocephalic.     Right Ear: Tympanic membrane normal.     Left Ear: Tympanic membrane normal.  Eyes:     General:        Right eye: No discharge.        Left eye: No discharge.     Pupils: Pupils are equal, round, and reactive to light.  Neck:     Thyroid: No thyromegaly.  Cardiovascular:     Rate and Rhythm: Normal rate and regular rhythm.     Heart sounds: Normal heart sounds. No murmur.  Pulmonary:     Effort: Pulmonary effort is normal. No respiratory distress.     Breath sounds: Normal breath sounds. No wheezing.  Abdominal:     General: Bowel sounds are normal. There is no distension.     Palpations: Abdomen is  soft.     Tenderness: There is no abdominal tenderness.  Musculoskeletal:        General: No tenderness. Normal range of motion.     Cervical back: Normal range of motion and neck supple.  Skin:    General: Skin is warm and dry.     Findings: No erythema or rash.  Neurological:     Mental Status: He is alert and oriented to person, place, and time.     Cranial Nerves: No cranial nerve deficit.     Deep Tendon Reflexes: Reflexes are normal and symmetric.  Psychiatric:        Behavior: Behavior normal.        Thought Content: Thought content normal.        Judgment: Judgment normal.     BP (!) 145/95   Pulse (!) 102   Temp 99.1 F (37.3 C) (Temporal)   Ht 6\' 5"  (1.956 m)   Wt (!) 310 lb 9.6 oz (140.9 kg)   SpO2 98%   BMI 36.83 kg/m        Assessment & Plan:  Travis Lyons comes in today with chief complaint of std check and Dysuria (x 1 week)   Diagnosis and orders  addressed:  1. Dysuria - urinalysis- dip and micro  2. Exposure to STD Safe sex discussed No sex for next 7 days Will go ahead and treat, labs pending. If positive will need to be retested - STD Screen (8) - C. trachomatis/N. gonorrhoeae RNA - azithromycin (ZITHROMAX) 500 MG tablet; Take 2 tablets (1,000 mg total) by mouth daily.  Dispense: 2 tablet; Refill: 0 - cefTRIAXone (ROCEPHIN) injection 500 mg  Evelina Dun, FNP

## 2019-11-20 LAB — STD SCREEN (8)
HIV Screen 4th Generation wRfx: NONREACTIVE
HSV 1 Glycoprotein G Ab, IgG: <0.91 index (ref 0.00–0.90)
HSV 2 IgG, Type Spec: <0.91 index (ref 0.00–0.90)
Hep A IgM: NEGATIVE
Hep B C IgM: NEGATIVE
Hep C Virus Ab: <0.1 s/co ratio (ref 0.0–0.9)
Hepatitis B Surface Ag: NEGATIVE
RPR Ser Ql: NONREACTIVE

## 2020-01-03 ENCOUNTER — Telehealth: Payer: BLUE CROSS/BLUE SHIELD | Admitting: Nurse Practitioner

## 2020-01-03 ENCOUNTER — Telehealth: Payer: Self-pay | Admitting: Nurse Practitioner

## 2020-01-03 DIAGNOSIS — J029 Acute pharyngitis, unspecified: Secondary | ICD-10-CM

## 2020-01-03 MED ORDER — AMOXICILLIN 500 MG PO CAPS: 500.0000 mg | ORAL_CAPSULE | Freq: Three times a day (TID) | ORAL | 0 refills | Status: DC

## 2020-01-03 NOTE — Progress Notes (Signed)

## 2020-01-03 NOTE — Progress Notes (Signed)
We are sorry that you are not feeling well.  Here is how we plan to help!  * this is your second evisit today. Did you mean to resubmit?  Your symptoms indicate a likely viral infection (Pharyngitis).   Pharyngitis is inflammation in the back of the throat which can cause a sore throat, scratchiness and sometimes difficulty swallowing.   Pharyngitis is typically caused by a respiratory virus and will just run its course.  Please keep in mind that your symptoms could last up to 10 days.  For throat pain, we recommend over the counter oral pain relief medications such as acetaminophen or aspirin, or anti-inflammatory medications such as ibuprofen or naproxen sodium.  Topical treatments such as oral throat lozenges or sprays may be used as needed.  Avoid close contact with loved ones, especially the very young and elderly.  Remember to wash your hands thoroughly throughout the day as this is the number one way to prevent the spread of infection and wipe down door knobs and counters with disinfectant.  After careful review of your answers, I would not recommend and antibiotic for your condition.  Antibiotics should not be used to treat conditions that we suspect are caused by viruses like the virus that causes the common cold or flu. However, some people can have Strep with atypical symptoms. You may need formal testing in clinic or office to confirm if your symptoms continue or worsen.  Providers prescribe antibiotics to treat infections caused by bacteria. Antibiotics are very powerful in treating bacterial infections when they are used properly.  To maintain their effectiveness, they should be used only when necessary.  Overuse of antibiotics has resulted in the development of super bugs that are resistant to treatment!    Home Care:  Only take medications as instructed by your medical team.  Do not drink alcohol while taking these medications.  A steam or ultrasonic humidifier can help congestion.   You can place a towel over your head and breathe in the steam from hot water coming from a faucet.  Avoid close contacts especially the very young and the elderly.  Cover your mouth when you cough or sneeze.  Always remember to wash your hands.  Get Help Right Away If:  You develop worsening fever or throat pain.  You develop a severe head ache or visual changes.  Your symptoms persist after you have completed your treatment plan.  Make sure you  Understand these instructions.  Will watch your condition.  Will get help right away if you are not doing well or get worse.  Your e-visit answers were reviewed by a board certified advanced clinical practitioner to complete your personal care plan.  Depending on the condition, your plan could have included both over the counter or prescription medications.  If there is a problem please reply  once you have received a response from your provider.  Your safety is important to [place].  If you have drug allergies check your prescription carefully.    You can use MyChart to ask questions about todays visit, request a non-urgent call back, or ask for a work or school excuse for 24 hours related to this e-Visit. If it has been greater than 24 hours you will need to follow up with your provider, or enter a new e-Visit to address those concerns.  You will get an e-mail in the next two days asking about your experience.  I hope that your e-visit has been valuable and will speed  your recovery. Thank you for using e-visits.      5-10 minutes spent reviewing and documenting in chart.

## 2020-01-05 ENCOUNTER — Telehealth (INDEPENDENT_AMBULATORY_CARE_PROVIDER_SITE_OTHER): Payer: Self-pay | Admitting: Family Medicine

## 2020-01-05 DIAGNOSIS — J029 Acute pharyngitis, unspecified: Secondary | ICD-10-CM

## 2020-01-05 MED ORDER — PREDNISONE 10 MG (21) PO TBPK: ORAL_TABLET | ORAL | 0 refills | Status: DC

## 2020-01-05 MED ORDER — MAGIC MOUTHWASH W/LIDOCAINE: ORAL | 0 refills | Status: DC

## 2020-01-05 NOTE — Telephone Encounter (Signed)
Telephone visit  Subjective: CC: sore throat, decreased appetite PCP: Terald Sleeper, PA-C [id number] R Ramsour is a 24 y.o. male calls for telephone consult today. Patient provides verbal consent for consult held via phone.  Due to COVID-19 pandemic this visit was conducted virtually. This visit type was conducted due to national recommendations for restrictions regarding the COVID-19 Pandemic (e.g. social distancing, sheltering in place) in an effort to limit this patient's exposure and mitigate transmission in our community. All issues noted in this document were discussed and addressed.  A physical exam was not performed with this format.   Location of patient: home Location of provider: Working remotely from home Others present for call: mom  1. Sore throat Mother calls to report that patient has ongoing sore throat that seems to be more severe on the right side. Has had difficulty swallowing soft foods. She cannot palpate any enlarged lymph nodes. He has had no recurrent fever. He had a low-grade fever last night to 99.6 F and this is with use of alternating Tylenol and Motrin. He does note that the pain on the right side seems to radiate down some into his chest. He has had a mild headache but no rash, nausea, vomiting. He was tested for Covid and this was negative.   ROS: Per HPI  Allergies  Allergen Reactions  . Cefzil [Cefprozil]    No past medical history on file.  Current Outpatient Medications:  .  azithromycin (ZITHROMAX) 500 MG tablet, Take 2 tablets (1,000 mg total) by mouth daily., Disp: 2 tablet, Rfl: 0  Assessment/ Plan: 24 y.o. male   1. Pharyngitis, unspecified etiology Presumed to be viral. Will empirically treat with oral prednisone since this is helped in the past. I also send in Magic mouthwash containing lidocaine. I discussed with mother home care instructions and reasons for reevaluation. She was good understanding and will follow up as needed  Meds ordered  this encounter  Medications  . predniSONE (STERAPRED UNI-PAK 21 TAB) 10 MG (21) TBPK tablet    Sig: As directed x 6 days    Dispense:  21 tablet    Refill:  0  . magic mouthwash w/lidocaine SOLN    Sig: Gargle and spit 25mL every 6 hours as needed for sore throat    Dispense:  480 mL    Refill:  0   Start time: 5:21 End time: 5:33pm  Total time spent on patient care (including telephone call/ virtual visit): 20 minutes  [place], [place] [phone]

## 2020-06-10 ENCOUNTER — Encounter: Payer: Self-pay | Admitting: Family Medicine

## 2020-06-10 ENCOUNTER — Ambulatory Visit (INDEPENDENT_AMBULATORY_CARE_PROVIDER_SITE_OTHER): Payer: No Typology Code available for payment source | Admitting: Family Medicine

## 2020-06-10 ENCOUNTER — Other Ambulatory Visit (HOSPITAL_COMMUNITY)
Admission: RE | Admit: 2020-06-10 | Discharge: 2020-06-10 | Disposition: A | Discharge status: Home / Self Care | Payer: No Typology Code available for payment source | Source: Ambulatory Visit | Attending: Family Medicine | Admitting: Family Medicine

## 2020-06-10 ENCOUNTER — Other Ambulatory Visit: Payer: Self-pay

## 2020-06-10 VITALS — BP 167/106 | HR 86 | Temp 98.3°F | Ht 77.0 in | Wt 327.0 lb

## 2020-06-10 DIAGNOSIS — Z113 Encounter for screening for infections with a predominantly sexual mode of transmission: Secondary | ICD-10-CM | POA: Insufficient documentation

## 2020-06-10 DIAGNOSIS — R369 Urethral discharge, unspecified: Secondary | ICD-10-CM | POA: Insufficient documentation

## 2020-06-10 DIAGNOSIS — R300 Dysuria: Secondary | ICD-10-CM | POA: Diagnosis not present

## 2020-06-10 DIAGNOSIS — N50812 Left testicular pain: Secondary | ICD-10-CM | POA: Diagnosis not present

## 2020-06-10 DIAGNOSIS — N50811 Right testicular pain: Secondary | ICD-10-CM | POA: Diagnosis not present

## 2020-06-10 LAB — URINALYSIS, COMPLETE
Bilirubin, UA: NEGATIVE
Glucose, UA: NEGATIVE
Ketones, UA: NEGATIVE
Leukocytes,UA: NEGATIVE
Nitrite, UA: NEGATIVE
Protein,UA: NEGATIVE
RBC, UA: NEGATIVE
Specific Gravity, UA: >1.03 — ABNORMAL HIGH (ref 1.005–1.030)
Urobilinogen, Ur: 0.2 mg/dL (ref 0.2–1.0)
pH, UA: 5.5 (ref 5.0–7.5)

## 2020-06-10 LAB — MICROSCOPIC EXAMINATION
Bacteria, UA: NONE SEEN
Epithelial Cells (non renal): NONE SEEN /hpf (ref 0–10)
RBC, Urine: NONE SEEN /hpf (ref 0–2)

## 2020-06-10 NOTE — Progress Notes (Signed)
   Assessment & Plan:  1. Dysuria - STD Screen (6) - Urine cytology ancillary only - Urine Culture - Urinalysis, Complete - Urine dipstick shows negative for all components.  Micro exam: negative for WBC's or RBC's. - Microscopic Examination - HCV Comment:  2. Pain in both testicles - STD Screen (6) - Urine cytology ancillary only  3. Penile discharge - STD Screen (6) - Urine cytology ancillary only   Follow up plan: Return if symptoms worsen or fail to improve.  Hendricks Limes, MSN, APRN, FNP-C [facility]  Subjective:   Patient ID: Travis Lyons, male    DOB: 1996/07/02, 24 y.o.   MRN: [id number]  HPI: Travis Lyons is a 24 y.o. male presenting on 06/10/2020 for Dysuria (x 6 days. Patient states he thinks he has a std.) and Testicle Pain  Patient c/o dysuria, bilateral testicle pain, and clear penile discharge x6 days. Patient reports he did something stupid while he was at the beach. Denies any masses, lesions, erythema.    ROS: Negative unless specifically indicated above in HPI.   Relevant past medical history reviewed and updated as indicated.   Allergies and medications reviewed and updated.  No current outpatient medications on file.  Allergies  Allergen Reactions  . Cefzil [Cefprozil] Rash    Tolerated other cephalosporins    Objective:   BP (!) 167/106   Pulse 86   Temp 98.3 F (36.8 C) (Temporal)   Ht 6\' 5"  (1.956 m)   Wt (!) 327 lb (148.3 kg)   SpO2 100%   BMI 38.78 kg/m    Physical Exam Vitals reviewed.  Constitutional:      General: He is not in acute distress.    Appearance: Normal appearance. He is obese. He is not ill-appearing, toxic-appearing or diaphoretic.  HENT:     Head: Normocephalic and atraumatic.  Eyes:     General: No scleral icterus.       Right eye: No discharge.        Left eye: No discharge.     Conjunctiva/sclera: Conjunctivae normal.  Cardiovascular:     Rate and Rhythm: Normal rate.    Pulmonary:     Effort: Pulmonary effort is normal. No respiratory distress.  Musculoskeletal:        General: Normal range of motion.     Cervical back: Normal range of motion.  Skin:    General: Skin is warm and dry.  Neurological:     Mental Status: He is alert and oriented to person, place, and time. Mental status is at baseline.  Psychiatric:        Mood and Affect: Mood normal.        Behavior: Behavior normal.        Thought Content: Thought content normal.        Judgment: Judgment normal.

## 2020-06-11 ENCOUNTER — Other Ambulatory Visit: Payer: Self-pay | Admitting: Family Medicine

## 2020-06-11 DIAGNOSIS — R300 Dysuria: Secondary | ICD-10-CM

## 2020-06-11 DIAGNOSIS — R369 Urethral discharge, unspecified: Secondary | ICD-10-CM

## 2020-06-11 LAB — URINE CYTOLOGY ANCILLARY ONLY
Chlamydia: NEGATIVE
Comment: NEGATIVE
Comment: NEGATIVE
Comment: NORMAL
Neisseria Gonorrhea: NEGATIVE
Trichomonas: NEGATIVE

## 2020-06-11 LAB — URINE CULTURE: Organism ID, Bacteria: NO GROWTH

## 2020-06-11 LAB — STD SCREEN (6)
HCV Ab: <0.1 s/co ratio (ref 0.0–0.9)
HIV Screen 4th Generation wRfx: NONREACTIVE
HSV 1 Glycoprotein G Ab, IgG: 20.6 index — ABNORMAL HIGH (ref 0.00–0.90)
HSV 2 IgG, Type Spec: <0.91 index (ref 0.00–0.90)
Hepatitis B Surface Ag: NEGATIVE
RPR Ser Ql: NONREACTIVE

## 2020-06-11 LAB — HCV COMMENT:

## 2020-06-11 MED ORDER — AZITHROMYCIN 500 MG PO TABS: 1000.0000 mg | ORAL_TABLET | Freq: Once | ORAL | 0 refills | Status: AC

## 2020-06-11 MED ORDER — CEFTRIAXONE SODIUM 250 MG IJ SOLR: 250.0000 mg | Freq: Once | INTRAMUSCULAR | Status: DC

## 2020-06-11 NOTE — Telephone Encounter (Signed)
Called patient and explained versus sending message back. All questions were answered.

## 2020-06-13 ENCOUNTER — Encounter: Payer: Self-pay | Admitting: Family Medicine

## 2022-10-11 ENCOUNTER — Telehealth: Payer: Self-pay

## 2022-10-11 NOTE — Telephone Encounter (Signed)
That is fine 

## 2022-10-12 NOTE — Telephone Encounter (Signed)
Appt made

## 2022-10-17 ENCOUNTER — Ambulatory Visit (INDEPENDENT_AMBULATORY_CARE_PROVIDER_SITE_OTHER): Payer: PRIVATE HEALTH INSURANCE | Admitting: Nurse Practitioner

## 2022-10-17 ENCOUNTER — Encounter: Payer: Self-pay | Admitting: Nurse Practitioner

## 2022-10-17 VITALS — BP 155/99 | HR 104 | Temp 98.3°F | Resp 20 | Ht 77.0 in | Wt 345.0 lb

## 2022-10-17 DIAGNOSIS — I10 Essential (primary) hypertension: Secondary | ICD-10-CM | POA: Diagnosis not present

## 2022-10-17 DIAGNOSIS — F419 Anxiety disorder, unspecified: Secondary | ICD-10-CM

## 2022-10-17 DIAGNOSIS — Z6841 Body Mass Index (BMI) 40.0 and over, adult: Secondary | ICD-10-CM

## 2022-10-17 MED ORDER — LISINOPRIL 10 MG PO TABS: 10.0000 mg | ORAL_TABLET | Freq: Every day | ORAL | 1 refills | Status: DC

## 2022-10-17 MED ORDER — ESCITALOPRAM OXALATE 10 MG PO TABS: 10.0000 mg | ORAL_TABLET | Freq: Every day | ORAL | 5 refills | Status: DC

## 2022-10-17 NOTE — Progress Notes (Signed)
Subjective:    Patient ID: Travis Lyons, male    DOB: 08-25-1996, 26 y.o.   MRN: [id number]   Chief Complaint: anxiety and depression  HPI Patient come sin today to discuss several things - anxiety and depression- has had off and on episodes for several years. Has never been on any medications for this    10/17/2022    2:45 PM  GAD 7 : Generalized Anxiety Score  Nervous, Anxious, on Edge 3  Control/stop worrying 0  Worry too much - different things 0  Trouble relaxing 0  Restless 0  Easily annoyed or irritable 1  Afraid - awful might happen 0  Total GAD 7 Score 4  Anxiety Difficulty Somewhat difficult       10/17/2022    2:46 PM 06/10/2020    9:03 AM 11/19/2019    2:32 PM  Depression screen PHQ 2/9  Decreased Interest 0 0 0  Down, Depressed, Hopeless 0 0 0  PHQ - 2 Score 0 0 0  Altered sleeping 0    Tired, decreased energy 0    Change in appetite 0    Feeling bad or failure about yourself  0    Trouble concentrating 0    Moving slowly or fidgety/restless 0    Suicidal thoughts 0    PHQ-9 Score 0    Difficult doing work/chores Not difficult at all      - concerned about his weight- weight is up 18lbs since last visit Wt Readings from Last 3 Encounters:  10/17/22 (!) 345 lb (156.5 kg)  06/10/20 (!) 327 lb (148.3 kg)  11/19/19 (!) 310 lb 9.6 oz (140.9 kg)   BMI Readings from Last 3 Encounters:  10/17/22 40.91 kg/m  06/10/20 38.78 kg/m  11/19/19 36.83 kg/m       Review of Systems  Constitutional:  Negative for diaphoresis.  Eyes:  Negative for pain.  Respiratory:  Negative for shortness of breath.   Cardiovascular:  Negative for chest pain, palpitations and leg swelling.  Gastrointestinal:  Negative for abdominal pain.  Endocrine: Negative for polydipsia.  Skin:  Negative for rash.  Neurological:  Negative for dizziness, weakness and headaches.  Hematological:  Does not bruise/bleed easily.  All other systems reviewed and are negative.       Objective:   Physical Exam Vitals and nursing note reviewed.  Constitutional:      Appearance: Normal appearance. He is well-developed.  Neck:     Thyroid: No thyroid mass or thyromegaly.     Vascular: No carotid bruit or JVD.     Trachea: Phonation normal.  Cardiovascular:     Rate and Rhythm: Normal rate and regular rhythm.  Pulmonary:     Effort: Pulmonary effort is normal. No respiratory distress.     Breath sounds: Normal breath sounds.  Abdominal:     General: Bowel sounds are normal.     Palpations: Abdomen is soft.     Tenderness: There is no abdominal tenderness.  Musculoskeletal:        General: Normal range of motion.     Cervical back: Normal range of motion and neck supple.  Lymphadenopathy:     Cervical: No cervical adenopathy.  Skin:    General: Skin is warm and dry.  Neurological:     Mental Status: He is alert and oriented to person, place, and time.  Psychiatric:        Behavior: Behavior normal.        Thought Content:  Thought content normal.        Judgment: Judgment normal.    BP (!) 155/99   Pulse (!) 104   Temp 98.3 F (36.8 C) (Temporal)   Resp 20   Ht _0  (1.956 m)   Wt (!) 345 lb (156.5 kg)   SpO2 98%   BMI 40.91 kg/m         Assessment & Plan:   HAZEN BRUMETT in today with chief complaint of Weight Loss   1. Primary hypertension Low sodium diet - lisinopril (ZESTRIL) 10 MG tablet; Take 1 tablet (10 mg total) by mouth daily.  Dispense: 90 tablet; Refill: 1 - CBC with Differential/Platelet - CMP14+EGFR - Lipid panel  2. Anxiety Stress management - escitalopram (LEXAPRO) 10 MG tablet; Take 1 tablet (10 mg total) by mouth daily.  Dispense: 30 tablet; Refill: 5  3. Morbid obesity ([place]) Discussed diet and exercise for person with BMI >25 Will recheck weight in 3-6 months     The above assessment and management plan was discussed with the patient. The patient verbalized understanding of and has agreed to the management  plan. Patient is aware to call the clinic if symptoms persist or worsen. Patient is aware when to return to the clinic for a follow-up visit. Patient educated on when it is appropriate to go to the emergency department.   Mary-Margaret Hassell Done, FNP

## 2022-10-18 LAB — CMP14+EGFR
ALT: 36 IU/L (ref 0–44)
AST: 19 IU/L (ref 0–40)
Albumin/Globulin Ratio: 1.8 (ref 1.2–2.2)
Albumin: 4.9 g/dL (ref 4.3–5.2)
Alkaline Phosphatase: 48 IU/L (ref 44–121)
BUN/Creatinine Ratio: 17 (ref 9–20)
BUN: 16 mg/dL (ref 6–20)
Bilirubin Total: 0.7 mg/dL (ref 0.0–1.2)
CO2: 25 mmol/L (ref 20–29)
Calcium: 9.8 mg/dL (ref 8.7–10.2)
Chloride: 103 mmol/L (ref 96–106)
Creatinine, Ser: 0.94 mg/dL (ref 0.76–1.27)
Globulin, Total: 2.7 g/dL (ref 1.5–4.5)
Glucose: 98 mg/dL (ref 70–99)
Potassium: 4.5 mmol/L (ref 3.5–5.2)
Sodium: 140 mmol/L (ref 134–144)
Total Protein: 7.6 g/dL (ref 6.0–8.5)
eGFR: 115 mL/min/{1.73_m2} (ref >59)

## 2022-10-18 LAB — CBC WITH DIFFERENTIAL/PLATELET
Basophils Absolute: 0.1 10*3/uL (ref 0.0–0.2)
Basos: 1 %
EOS (ABSOLUTE): 0.5 10*3/uL — ABNORMAL HIGH (ref 0.0–0.4)
Eos: 5 %
Hematocrit: 46.2 % (ref 37.5–51.0)
Hemoglobin: 15.6 g/dL (ref 13.0–17.7)
Immature Grans (Abs): 0.1 10*3/uL (ref 0.0–0.1)
Immature Granulocytes: 1 %
Lymphocytes Absolute: 2.6 10*3/uL (ref 0.7–3.1)
Lymphs: 30 %
MCH: 30.1 pg (ref 26.6–33.0)
MCHC: 33.8 g/dL (ref 31.5–35.7)
MCV: 89 fL (ref 79–97)
Monocytes Absolute: 0.9 10*3/uL (ref 0.1–0.9)
Monocytes: 10 %
Neutrophils Absolute: 4.5 10*3/uL (ref 1.4–7.0)
Neutrophils: 53 %
Platelets: 287 10*3/uL (ref 150–450)
RBC: 5.18 x10E6/uL (ref 4.14–5.80)
RDW: 12.9 % (ref 11.6–15.4)
WBC: 8.7 10*3/uL (ref 3.4–10.8)

## 2022-10-18 LAB — LIPID PANEL
Chol/HDL Ratio: 6.9 ratio — ABNORMAL HIGH (ref 0.0–5.0)
Cholesterol, Total: 201 mg/dL — ABNORMAL HIGH (ref 100–199)
HDL: 29 mg/dL — ABNORMAL LOW (ref >39)
LDL Chol Calc (NIH): 79 mg/dL (ref 0–99)
Triglycerides: 581 mg/dL (ref 0–149)
VLDL Cholesterol Cal: 93 mg/dL — ABNORMAL HIGH (ref 5–40)

## 2022-11-10 ENCOUNTER — Ambulatory Visit: Payer: PRIVATE HEALTH INSURANCE | Admitting: Nurse Practitioner

## 2022-11-10 ENCOUNTER — Encounter: Payer: Self-pay | Admitting: Nurse Practitioner

## 2022-11-10 VITALS — BP 145/80 | HR 75 | Temp 97.1°F | Resp 20 | Ht 77.0 in | Wt 335.0 lb

## 2022-11-10 DIAGNOSIS — I10 Essential (primary) hypertension: Secondary | ICD-10-CM | POA: Diagnosis not present

## 2022-11-10 DIAGNOSIS — F419 Anxiety disorder, unspecified: Secondary | ICD-10-CM | POA: Insufficient documentation

## 2022-11-10 MED ORDER — LISINOPRIL 20 MG PO TABS: 20.0000 mg | ORAL_TABLET | Freq: Every day | ORAL | 1 refills | Status: DC

## 2022-11-10 NOTE — Progress Notes (Signed)
Subjective:    Patient ID: Travis Lyons, male    DOB: 05-04-1996, 27 y.o.   MRN: [id number]   Chief Complaint: medical management of chronic issues     HPI:  Travis Lyons is a 27 y.o. who identifies as a male who was assigned male at birth.   Comes in today for follow up of the following chronic medical issues:  1. Primary hypertension No c/o chest pain, sob or headache. Does check blood pressure at home and has been 407'W systolic. Started on lisinopril '10mg'$  at last visit. BP Readings from Last 3 Encounters:  11/10/22 (!) 145/80  10/17/22 (!) 155/99  06/10/20 (!) 167/106      2. Anxiety Is on lexapro and is doing well.    11/10/2022    3:28 PM 10/17/2022    2:46 PM 06/10/2020    9:03 AM  Depression screen PHQ 2/9  Decreased Interest 0 0 0  Down, Depressed, Hopeless 0 0 0  PHQ - 2 Score 0 0 0  Altered sleeping 0 0   Tired, decreased energy 0 0   Change in appetite 0 0   Feeling bad or failure about yourself  0 0   Trouble concentrating 0 0   Moving slowly or fidgety/restless 0 0   Suicidal thoughts 0 0   PHQ-9 Score 0 0   Difficult doing work/chores Not difficult at all Not difficult at all       11/10/2022    3:28 PM 10/17/2022    2:45 PM  GAD 7 : Generalized Anxiety Score  Nervous, Anxious, on Edge 0 3  Control/stop worrying 0 0  Worry too much - different things 0 0  Trouble relaxing 0 0  Restless 0 0  Easily annoyed or irritable 0 1  Afraid - awful might happen 0 0  Total GAD 7 Score 0 4  Anxiety Difficulty Not difficult at all Somewhat difficult       New complaints: None today  Allergies  Allergen Reactions   Cefzil [Cefprozil] Rash    Tolerated other cephalosporins   Outpatient Encounter Medications as of 11/10/2022  Medication Sig   escitalopram (LEXAPRO) 10 MG tablet Take 1 tablet (10 mg total) by mouth daily.   lisinopril (ZESTRIL) 10 MG tablet Take 1 tablet (10 mg total) by mouth daily.   [DISCONTINUED] cefTRIAXone (ROCEPHIN) injection  250 mg    No facility-administered encounter medications on file as of 11/10/2022.    No past surgical history on file.  No family history on file.    Controlled substance contract: n/a     Review of Systems  Constitutional:  Negative for diaphoresis.  Eyes:  Negative for pain.  Respiratory:  Negative for shortness of breath.   Cardiovascular:  Negative for chest pain, palpitations and leg swelling.  Gastrointestinal:  Negative for abdominal pain.  Endocrine: Negative for polydipsia.  Skin:  Negative for rash.  Neurological:  Negative for dizziness, weakness and headaches.  Hematological:  Does not bruise/bleed easily.  All other systems reviewed and are negative.      Objective:   Physical Exam Vitals and nursing note reviewed.  Constitutional:      Appearance: Normal appearance. He is well-developed.  Neck:     Thyroid: No thyroid mass or thyromegaly.     Vascular: No carotid bruit or JVD.     Trachea: Phonation normal.  Cardiovascular:     Rate and Rhythm: Normal rate and regular rhythm.  Pulmonary:  Effort: Pulmonary effort is normal. No respiratory distress.     Breath sounds: Normal breath sounds.  Abdominal:     General: Bowel sounds are normal.     Palpations: Abdomen is soft.     Tenderness: There is no abdominal tenderness.  Musculoskeletal:        General: Normal range of motion.     Cervical back: Normal range of motion and neck supple.  Lymphadenopathy:     Cervical: No cervical adenopathy.  Skin:    General: Skin is warm and dry.  Neurological:     Mental Status: He is alert and oriented to person, place, and time.  Psychiatric:        Behavior: Behavior normal.        Thought Content: Thought content normal.        Judgment: Judgment normal.     BP (!) 145/80   Pulse 75   Temp (!) 97.1 F (36.2 C) (Temporal)   Resp 20   Ht '6\' 5"'$  (1.956 m)   Wt (!) 335 lb (152 kg)   SpO2 96%   BMI 39.73 kg/m        Assessment & Plan:  Travis Lyons comes in today with chief complaint of Hypertension   Diagnosis and orders addressed:  1. Primary hypertension Low sodium diet Increase lisinopril to '20mg'$  daily - lisinopril (ZESTRIL) 20 MG tablet; Take 1 tablet (20 mg total) by mouth daily.  Dispense: 90 tablet; Refill: 1  2. Anxiety Stress management Continue lexapro as prescribed.   Labs pending Health Maintenance reviewed Diet and exercise encouraged  Follow up plan: 2 months   Mary-Margaret Hassell Done, FNP

## 2022-11-10 NOTE — Patient Instructions (Signed)

## 2023-01-10 ENCOUNTER — Ambulatory Visit: Payer: PRIVATE HEALTH INSURANCE | Admitting: Nurse Practitioner

## 2023-01-17 ENCOUNTER — Ambulatory Visit (INDEPENDENT_AMBULATORY_CARE_PROVIDER_SITE_OTHER): Payer: PRIVATE HEALTH INSURANCE | Admitting: Nurse Practitioner

## 2023-01-17 ENCOUNTER — Encounter: Payer: Self-pay | Admitting: Nurse Practitioner

## 2023-01-17 VITALS — BP 130/76 | HR 78 | Temp 97.1°F | Resp 20 | Wt 334.0 lb

## 2023-01-17 DIAGNOSIS — I10 Essential (primary) hypertension: Secondary | ICD-10-CM | POA: Diagnosis not present

## 2023-01-17 DIAGNOSIS — F419 Anxiety disorder, unspecified: Secondary | ICD-10-CM

## 2023-01-17 MED ORDER — LISINOPRIL 20 MG PO TABS: 20.0000 mg | ORAL_TABLET | Freq: Every day | ORAL | 1 refills | Status: DC

## 2023-01-17 MED ORDER — ESCITALOPRAM OXALATE 10 MG PO TABS: 10.0000 mg | ORAL_TABLET | Freq: Every day | ORAL | 5 refills | Status: DC

## 2023-01-17 NOTE — Progress Notes (Signed)
Subjective:    Patient ID: Travis Lyons, male    DOB: January 20, 1996, 27 y.o.   MRN: [id number]   Chief Complaint: medical management of chronic issues     HPI:  Travis Lyons is a 28 y.o. who identifies as a male who was assigned male at birth.   Comes in today for follow up of the following chronic medical issues:  1. Primary hypertension Was started on lisinopril at last visit. He doe snot check his blood pressure at home. BP Readings from Last 3 Encounters:  11/10/22 (!) 145/80  10/17/22 (!) 155/99  06/10/20 (!) 167/106     2. Anxiety He was also started on lexapro at last visit. Since starting on meds he says that his family notices a change in him. Not as on edge.    11/10/2022    3:28 PM 10/17/2022    2:45 PM  GAD 7 : Generalized Anxiety Score  Nervous, Anxious, on Edge 0 3  Control/stop worrying 0 0  Worry too much - different things 0 0  Trouble relaxing 0 0  Restless 0 0  Easily annoyed or irritable 0 1  Afraid - awful might happen 0 0  Total GAD 7 Score 0 4  Anxiety Difficulty Not difficult at all Somewhat difficult       New complaints: None today  Allergies  Allergen Reactions   Cefzil [Cefprozil] Rash    Tolerated other cephalosporins   Outpatient Encounter Medications as of 01/17/2023  Medication Sig   escitalopram (LEXAPRO) 10 MG tablet Take 1 tablet (10 mg total) by mouth daily.   lisinopril (ZESTRIL) 20 MG tablet Take 1 tablet (20 mg total) by mouth daily.   No facility-administered encounter medications on file as of 01/17/2023.    No past surgical history on file.  No family history on file.    Controlled substance contract: n/a      Review of Systems  Constitutional:  Negative for diaphoresis.  Eyes:  Negative for pain.  Respiratory:  Negative for shortness of breath.   Cardiovascular:  Negative for chest pain, palpitations and leg swelling.  Gastrointestinal:  Negative for abdominal pain.  Endocrine: Negative for  polydipsia.  Skin:  Negative for rash.  Neurological:  Negative for dizziness, weakness and headaches.  Hematological:  Does not bruise/bleed easily.  All other systems reviewed and are negative.      Objective:   Physical Exam Vitals and nursing note reviewed.  Constitutional:      Appearance: Normal appearance. He is well-developed.  HENT:     Head: Normocephalic.     Nose: Nose normal.     Mouth/Throat:     Mouth: Mucous membranes are moist.     Pharynx: Oropharynx is clear.  Eyes:     Pupils: Pupils are equal, round, and reactive to light.  Neck:     Thyroid: No thyroid mass or thyromegaly.     Vascular: No carotid bruit or JVD.     Trachea: Phonation normal.  Cardiovascular:     Rate and Rhythm: Normal rate and regular rhythm.  Pulmonary:     Effort: Pulmonary effort is normal. No respiratory distress.     Breath sounds: Normal breath sounds.  Abdominal:     General: Bowel sounds are normal.     Palpations: Abdomen is soft.     Tenderness: There is no abdominal tenderness.  Musculoskeletal:        General: Normal range of motion.  Cervical back: Normal range of motion and neck supple.  Lymphadenopathy:     Cervical: No cervical adenopathy.  Skin:    General: Skin is warm and dry.  Neurological:     Mental Status: He is alert and oriented to person, place, and time.  Psychiatric:        Behavior: Behavior normal.        Thought Content: Thought content normal.        Judgment: Judgment normal.    BP 130/76   Pulse 78   Temp (!) 97.1 F (36.2 C) (Temporal)   Resp 20   Wt (!) 334 lb (151.5 kg)   SpO2 98%   BMI 39.61 kg/m         Assessment & Plan:   Travis Lyons comes in today with chief complaint of Medical Management of Chronic Issues   Diagnosis and orders addressed:  1. Primary hypertension Low sodium diet - lisinopril (ZESTRIL) 20 MG tablet; Take 1 tablet (20 mg total) by mouth daily.  Dispense: 90 tablet; Refill: 1  2.  Anxiety Stress management - escitalopram (LEXAPRO) 10 MG tablet; Take 1 tablet (10 mg total) by mouth daily.  Dispense: 30 tablet; Refill: 5   Labs pending Health Maintenance reviewed Diet and exercise encouraged  Follow up plan: 6 months   Mary-Margaret Hassell Done, FNP

## 2023-01-17 NOTE — Patient Instructions (Signed)
Exercising to Stay Healthy To become healthy and stay healthy, it is recommended that you do moderate-intensity and vigorous-intensity exercise. You can tell that you are exercising at a moderate intensity if your heart starts beating faster and you start breathing faster but can still hold a conversation. You can tell that you are exercising at a vigorous intensity if you are breathing much harder and faster and cannot hold a conversation while exercising. How can exercise benefit me? Exercising regularly is important. It has many health benefits, such as: Improving overall fitness, flexibility, and endurance. Increasing bone density. Helping with weight control. Decreasing body fat. Increasing muscle strength and endurance. Reducing stress and tension, anxiety, depression, or anger. Improving overall health. What guidelines should I follow while exercising? Before you start a new exercise program, talk with your health care provider. Do not exercise so much that you hurt yourself, feel dizzy, or get very short of breath. Wear comfortable clothes and wear shoes with good support. Drink plenty of water while you exercise to prevent dehydration or heat stroke. Work out until your breathing and your heartbeat get faster (moderate intensity). How often should I exercise? Choose an activity that you enjoy, and set realistic goals. Your health care provider can help you make an activity plan that is individually designed and works best for you. Exercise regularly as told by your health care provider. This may include: Doing strength training two times a week, such as: Lifting weights. Using resistance bands. Push-ups. Sit-ups. Yoga. Doing a certain intensity of exercise for a given amount of time. Choose from these options: A total of 150 minutes of moderate-intensity exercise every week. A total of 75 minutes of vigorous-intensity exercise every week. A mix of moderate-intensity and  vigorous-intensity exercise every week. Children, pregnant women, people who have not exercised regularly, people who are overweight, and older adults may need to talk with a health care provider about what activities are safe to perform. If you have a medical condition, be sure to talk with your health care provider before you start a new exercise program. What are some exercise ideas? Moderate-intensity exercise ideas include: Walking 1 mile (1.6 km) in about 15 minutes. Biking. Hiking. Golfing. Dancing. Water aerobics. Vigorous-intensity exercise ideas include: Walking 4.5 miles (7.2 km) or more in about 1 hour. Jogging or running 5 miles (8 km) in about 1 hour. Biking 10 miles (16.1 km) or more in about 1 hour. Lap swimming. Roller-skating or in-line skating. Cross-country skiing. Vigorous competitive sports, such as football, basketball, and soccer. Jumping rope. Aerobic dancing. What are some everyday activities that can help me get exercise? Yard work, such as: Pushing a lawn mower. Raking and bagging leaves. Washing your car. Pushing a stroller. Shoveling snow. Gardening. Washing windows or floors. How can I be more active in my day-to-day activities? Use stairs instead of an elevator. Take a walk during your lunch break. If you drive, park your car farther away from your work or school. If you take public transportation, get off one stop early and walk the rest of the way. Stand up or walk around during all of your indoor phone calls. Get up, stretch, and walk around every 30 minutes throughout the day. Enjoy exercise with a friend. Support to continue exercising will help you keep a regular routine of activity. Where to find more information You can find more information about exercising to stay healthy from: [place] Department of Health and Human Services: www.hhs.gov Centers for Disease Control and Prevention (  CDC): www.cdc.gov Summary Exercising regularly is  important. It will improve your overall fitness, flexibility, and endurance. Regular exercise will also improve your overall health. It can help you control your weight, reduce stress, and improve your bone density. Do not exercise so much that you hurt yourself, feel dizzy, or get very short of breath. Before you start a new exercise program, talk with your health care provider. This information is not intended to replace advice given to you by your health care provider. Make sure you discuss any questions you have with your health care provider. Document Revised: 02/19/2021 Document Reviewed: 02/19/2021 Elsevier Patient Education  2023 Elsevier Inc.  

## 2023-07-20 ENCOUNTER — Ambulatory Visit: Payer: PRIVATE HEALTH INSURANCE | Admitting: Nurse Practitioner

## 2023-08-04 ENCOUNTER — Ambulatory Visit: Payer: PRIVATE HEALTH INSURANCE | Admitting: Nurse Practitioner

## 2023-09-04 ENCOUNTER — Ambulatory Visit: Payer: PRIVATE HEALTH INSURANCE | Admitting: Nurse Practitioner

## 2023-09-04 NOTE — Progress Notes (Deleted)
   Subjective:    Patient ID: Travis Lyons, male    DOB: [birth year], 27 y.o.   MRN: [id number]   Chief Complaint: medical management of chronic issues     HPI:  Travis Lyons is a 27 y.o. who identifies as a male who was assigned male at birth.   noComes in today for follow up of the following chronic medical issues:  1. Primary hypertension  C/o chest pain, sob or headache. Does not check blood pressure at home. BP Readings from Last 3 Encounters:  01/17/23 130/76  11/10/22 (!) 145/80  10/17/22 (!) 155/99     2. Anxiety Is on lexapro daily and is doing well. ***   New complaints: ***  Allergies  Allergen Reactions   Cefzil [Cefprozil] Rash    Tolerated other cephalosporins   Outpatient Encounter Medications as of 09/04/2023  Medication Sig   escitalopram (LEXAPRO) 10 MG tablet Take 1 tablet (10 mg total) by mouth daily.   lisinopril (ZESTRIL) 20 MG tablet Take 1 tablet (20 mg total) by mouth daily.   No facility-administered encounter medications on file as of 09/04/2023.    No past surgical history on file.  No family history on file.    Controlled substance contract: ***     Review of Systems     Objective:   Physical Exam        Assessment & Plan:

## 2023-09-06 ENCOUNTER — Encounter: Payer: Self-pay | Admitting: Nurse Practitioner

## 2023-09-25 ENCOUNTER — Other Ambulatory Visit: Payer: Self-pay | Admitting: Nurse Practitioner

## 2023-09-25 DIAGNOSIS — I10 Essential (primary) hypertension: Secondary | ICD-10-CM

## 2023-11-10 ENCOUNTER — Encounter: Payer: Self-pay | Admitting: Nurse Practitioner

## 2023-11-10 ENCOUNTER — Ambulatory Visit (INDEPENDENT_AMBULATORY_CARE_PROVIDER_SITE_OTHER): Payer: 59 | Admitting: Nurse Practitioner

## 2023-11-10 VITALS — BP 166/96 | HR 95 | Temp 98.3°F | Wt 357.0 lb

## 2023-11-10 DIAGNOSIS — I10 Essential (primary) hypertension: Secondary | ICD-10-CM | POA: Diagnosis not present

## 2023-11-10 DIAGNOSIS — F419 Anxiety disorder, unspecified: Secondary | ICD-10-CM | POA: Diagnosis not present

## 2023-11-10 LAB — LIPID PANEL

## 2023-11-10 MED ORDER — LISINOPRIL 20 MG PO TABS: 20.0000 mg | ORAL_TABLET | Freq: Every day | ORAL | 1 refills | Status: AC

## 2023-11-10 MED ORDER — ESCITALOPRAM OXALATE 10 MG PO TABS
10.0000 mg | ORAL_TABLET | Freq: Every day | ORAL | 1 refills | Status: DC
Start: 2023-11-10 — End: 2024-03-27

## 2023-11-10 NOTE — Patient Instructions (Signed)

## 2023-11-10 NOTE — Progress Notes (Signed)
 Subjective:    Patient ID: Travis Lyons, male    DOB: 04-02-1996, 28 y.o.   MRN: [id number]   Chief Complaint: medical management of chronic issues     HPI:  Travis Lyons is a 28 y.o. who identifies as a male who was assigned male at birth.   Social history: Lives with: fiancee Work history: Therapist, occupational for schools   Comes in today for follow up of the following chronic medical issues:  1. Primary hypertension No c/o chest pain, sob or headache. Does not check blood pressure at home. BP Readings from Last 3 Encounters:  01/17/23 130/76  11/10/22 (!) 145/80  10/17/22 (!) 155/99    2. Anxiety Is currently on lexapro  and is dong well.    11/10/2023    2:09 PM 01/17/2023    8:46 AM 11/10/2022    3:28 PM  Depression screen PHQ 2/9  Decreased Interest 1 0 0  Down, Depressed, Hopeless 0 0 0  PHQ - 2 Score 1 0 0  Altered sleeping  0 0  Tired, decreased energy  0 0  Change in appetite  0 0  Feeling bad or failure about yourself   0 0  Trouble concentrating  0 0  Moving slowly or fidgety/restless  0 0  Suicidal thoughts  0 0  PHQ-9 Score  0 0  Difficult doing work/chores  Not difficult at all Not difficult at all      01/17/2023    8:47 AM 11/10/2022    3:28 PM 10/17/2022    2:45 PM  GAD 7 : Generalized Anxiety Score  Nervous, Anxious, on Edge 0 0 3  Control/stop worrying 0 0 0  Worry too much - different things 0 0 0  Trouble relaxing 0 0 0  Restless 0 0 0  Easily annoyed or irritable 0 0 1  Afraid - awful might happen 0 0 0  Total GAD 7 Score 0 0 4  Anxiety Difficulty Not difficult at all Not difficult at all Somewhat difficult       New complaints: None today  Allergies  Allergen Reactions   Cefzil [Cefprozil] Rash    Tolerated other cephalosporins   Outpatient Encounter Medications as of 11/10/2023  Medication Sig   escitalopram  (LEXAPRO ) 10 MG tablet Take 1 tablet (10 mg total) by mouth daily.   lisinopril  (ZESTRIL ) 20 MG tablet Take 1  tablet (20 mg total) by mouth daily. **NEEDS TO BE SEEN BEFORE NEXT REFILL**   No facility-administered encounter medications on file as of 11/10/2023.    No past surgical history on file.  No family history on file.    Controlled substance contract: n/a     Review of Systems  Constitutional:  Negative for diaphoresis.  Eyes:  Negative for pain.  Respiratory:  Negative for shortness of breath.   Cardiovascular:  Negative for chest pain, palpitations and leg swelling.  Gastrointestinal:  Negative for abdominal pain.  Endocrine: Negative for polydipsia.  Skin:  Negative for rash.  Neurological:  Negative for dizziness, weakness and headaches.  Hematological:  Does not bruise/bleed easily.  All other systems reviewed and are negative.      Objective:   Physical Exam Vitals and nursing note reviewed.  Constitutional:      Appearance: Normal appearance. He is well-developed.  HENT:     Head: Normocephalic.     Nose: Nose normal.     Mouth/Throat:     Mouth: Mucous membranes are moist.  Pharynx: Oropharynx is clear.  Eyes:     Pupils: Pupils are equal, round, and reactive to light.  Neck:     Thyroid: No thyroid mass or thyromegaly.     Vascular: No carotid bruit or JVD.     Trachea: Phonation normal.  Cardiovascular:     Rate and Rhythm: Normal rate and regular rhythm.  Pulmonary:     Effort: Pulmonary effort is normal. No respiratory distress.     Breath sounds: Normal breath sounds.  Abdominal:     General: Bowel sounds are normal.     Palpations: Abdomen is soft.     Tenderness: There is no abdominal tenderness.  Musculoskeletal:        General: Normal range of motion.     Cervical back: Normal range of motion and neck supple.  Lymphadenopathy:     Cervical: No cervical adenopathy.  Skin:    General: Skin is warm and dry.  Neurological:     Mental Status: He is alert and oriented to person, place, and time.  Psychiatric:        Behavior: Behavior  normal.        Thought Content: Thought content normal.        Judgment: Judgment normal.    BP (!) 166/96   Pulse 95   Temp 98.3 F (36.8 C) (Temporal)   Wt (!) 357 lb (161.9 kg)   SpO2 97%   BMI 42.33 kg/m         Assessment & Plan:   Travis Lyons in today with chief complaint of Medical Management of Chronic Issues   1. Primary hypertension (Primary) Low sodium diet - CBC with Differential/Platelet - CMP14+EGFR - Lipid panel - lisinopril  (ZESTRIL ) 20 MG tablet; Take 1 tablet (20 mg total) by mouth daily.  Dispense: 90 tablet; Refill: 1  2. Anxiety Stress management - escitalopram  (LEXAPRO ) 10 MG tablet; Take 1 tablet (10 mg total) by mouth daily.  Dispense: 90 tablet; Refill: 1    The above assessment and management plan was discussed with the patient. The patient verbalized understanding of and has agreed to the management plan. Patient is aware to call the clinic if symptoms persist or worsen. Patient is aware when to return to the clinic for a follow-up visit. Patient educated on when it is appropriate to go to the emergency department.   Mary-Margaret Gladis, FNP

## 2023-11-11 LAB — CMP14+EGFR
ALT: 30 IU/L (ref 0–44)
AST: 20 IU/L (ref 0–40)
Albumin: 4.7 g/dL (ref 4.3–5.2)
Alkaline Phosphatase: 52 IU/L (ref 44–121)
BUN/Creatinine Ratio: 15 (ref 9–20)
BUN: 17 mg/dL (ref 6–20)
Bilirubin Total: 0.4 mg/dL (ref 0.0–1.2)
CO2: 20 mmol/L (ref 20–29)
Calcium: 10.4 mg/dL — ABNORMAL HIGH (ref 8.7–10.2)
Chloride: 103 mmol/L (ref 96–106)
Creatinine, Ser: 1.12 mg/dL (ref 0.76–1.27)
Globulin, Total: 3 g/dL (ref 1.5–4.5)
Glucose: 94 mg/dL (ref 70–99)
Potassium: 4.7 mmol/L (ref 3.5–5.2)
Sodium: 143 mmol/L (ref 134–144)
Total Protein: 7.7 g/dL (ref 6.0–8.5)
eGFR: 92 mL/min/{1.73_m2} (ref >59)

## 2023-11-11 LAB — CBC WITH DIFFERENTIAL/PLATELET
Basophils Absolute: 0.1 10*3/uL (ref 0.0–0.2)
Basos: 1 %
EOS (ABSOLUTE): 0.8 10*3/uL — ABNORMAL HIGH (ref 0.0–0.4)
Eos: 8 %
Hematocrit: 44.6 % (ref 37.5–51.0)
Hemoglobin: 14.8 g/dL (ref 13.0–17.7)
Immature Grans (Abs): 0 10*3/uL (ref 0.0–0.1)
Immature Granulocytes: 0 %
Lymphocytes Absolute: 2.8 10*3/uL (ref 0.7–3.1)
Lymphs: 28 %
MCH: 29.6 pg (ref 26.6–33.0)
MCHC: 33.2 g/dL (ref 31.5–35.7)
MCV: 89 fL (ref 79–97)
Monocytes Absolute: 1.1 10*3/uL — ABNORMAL HIGH (ref 0.1–0.9)
Monocytes: 11 %
Neutrophils Absolute: 5.1 10*3/uL (ref 1.4–7.0)
Neutrophils: 52 %
Platelets: 302 10*3/uL (ref 150–450)
RBC: 5 x10E6/uL (ref 4.14–5.80)
RDW: 12.4 % (ref 11.6–15.4)
WBC: 10.1 10*3/uL (ref 3.4–10.8)

## 2023-11-11 LAB — LIPID PANEL
Cholesterol, Total: 195 mg/dL (ref 100–199)
HDL: 26 mg/dL — ABNORMAL LOW (ref >39)
LDL CALC COMMENT:: 7.5 ratio — ABNORMAL HIGH (ref 0.0–5.0)
LDL Chol Calc (NIH): 54 mg/dL (ref 0–99)
Triglycerides: 789 mg/dL (ref 0–149)
VLDL Cholesterol Cal: 115 mg/dL — ABNORMAL HIGH (ref 5–40)

## 2023-11-13 ENCOUNTER — Telehealth: Payer: Self-pay | Admitting: Nurse Practitioner

## 2023-11-13 NOTE — Telephone Encounter (Signed)
 Patient aware of lab results.

## 2023-11-13 NOTE — Telephone Encounter (Signed)
 Copied from CRM [id number]. Topic: General - Other >> Nov 13, 2023  2:44 PM Ivette P wrote: Reason for CRM: Pt received a call and returned call.

## 2024-03-27 ENCOUNTER — Other Ambulatory Visit: Payer: Self-pay | Admitting: Nurse Practitioner

## 2024-03-27 DIAGNOSIS — F419 Anxiety disorder, unspecified: Secondary | ICD-10-CM

## 2024-05-07 ENCOUNTER — Ambulatory Visit: Payer: 59 | Admitting: Nurse Practitioner

## 2024-05-08 ENCOUNTER — Encounter: Payer: Self-pay | Admitting: Nurse Practitioner

## 2024-05-24 ENCOUNTER — Ambulatory Visit: Admitting: Nurse Practitioner

## 2024-05-24 NOTE — Progress Notes (Deleted)
 Subjective:    Patient ID: Travis Lyons, male    DOB: 17-Jan-1996, 28 y.o.   MRN: [id number]   Chief Complaint: medical management of chronic issues     HPI:  Travis Lyons is a 28 y.o. who identifies as a male who was assigned male at birth.   Social history: Lives with: fiancee Work history: Therapist, occupational for schools   Comes in today for follow up of the following chronic medical issues:  1. Primary hypertension No c/o chest pain, sob or headache. Does not check blood pressure at home. BP Readings from Last 3 Encounters:  11/10/23 (!) 166/96  01/17/23 130/76  11/10/22 (!) 145/80    2. Anxiety Is currently on lexapro  and is dong well.    11/10/2023    2:09 PM 01/17/2023    8:46 AM 11/10/2022    3:28 PM  Depression screen PHQ 2/9  Decreased Interest 1 0 0  Down, Depressed, Hopeless 0 0 0  PHQ - 2 Score 1 0 0  Altered sleeping  0 0  Tired, decreased energy  0 0  Change in appetite  0 0  Feeling bad or failure about yourself   0 0  Trouble concentrating  0 0  Moving slowly or fidgety/restless  0 0  Suicidal thoughts  0 0  PHQ-9 Score  0 0  Difficult doing work/chores  Not difficult at all Not difficult at all      01/17/2023    8:47 AM 11/10/2022    3:28 PM 10/17/2022    2:45 PM  GAD 7 : Generalized Anxiety Score  Nervous, Anxious, on Edge 0 0 3  Control/stop worrying 0 0 0  Worry too much - different things 0 0 0  Trouble relaxing 0 0 0  Restless 0 0 0  Easily annoyed or irritable 0 0 1  Afraid - awful might happen 0 0 0  Total GAD 7 Score 0 0 4  Anxiety Difficulty Not difficult at all Not difficult at all Somewhat difficult       New complaints: None today  Allergies  Allergen Reactions   Cefzil [Cefprozil] Rash    Tolerated other cephalosporins   Outpatient Encounter Medications as of 05/24/2024  Medication Sig   escitalopram  (LEXAPRO ) 10 MG tablet TAKE 1 TABLET BY MOUTH EVERY DAY   lisinopril  (ZESTRIL ) 20 MG tablet Take 1 tablet (20  mg total) by mouth daily. **NEEDS TO BE SEEN BEFORE NEXT REFILL**   No facility-administered encounter medications on file as of 05/24/2024.    No past surgical history on file.  No family history on file.    Controlled substance contract: n/a     Review of Systems  Constitutional:  Negative for diaphoresis.  Eyes:  Negative for pain.  Respiratory:  Negative for shortness of breath.   Cardiovascular:  Negative for chest pain, palpitations and leg swelling.  Gastrointestinal:  Negative for abdominal pain.  Endocrine: Negative for polydipsia.  Skin:  Negative for rash.  Neurological:  Negative for dizziness, weakness and headaches.  Hematological:  Does not bruise/bleed easily.  All other systems reviewed and are negative.      Objective:   Physical Exam Vitals and nursing note reviewed.  Constitutional:      Appearance: Normal appearance. He is well-developed.  HENT:     Head: Normocephalic.     Nose: Nose normal.     Mouth/Throat:     Mouth: Mucous membranes are moist.  Pharynx: Oropharynx is clear.  Eyes:     Pupils: Pupils are equal, round, and reactive to light.  Neck:     Thyroid: No thyroid mass or thyromegaly.     Vascular: No carotid bruit or JVD.     Trachea: Phonation normal.  Cardiovascular:     Rate and Rhythm: Normal rate and regular rhythm.  Pulmonary:     Effort: Pulmonary effort is normal. No respiratory distress.     Breath sounds: Normal breath sounds.  Abdominal:     General: Bowel sounds are normal.     Palpations: Abdomen is soft.     Tenderness: There is no abdominal tenderness.  Musculoskeletal:        General: Normal range of motion.     Cervical back: Normal range of motion and neck supple.  Lymphadenopathy:     Cervical: No cervical adenopathy.  Skin:    General: Skin is warm and dry.  Neurological:     Mental Status: He is alert and oriented to person, place, and time.  Psychiatric:        Behavior: Behavior normal.         Thought Content: Thought content normal.        Judgment: Judgment normal.    There were no vitals taken for this visit.        Assessment & Plan:   Travis Lyons in today with chief complaint of No chief complaint on file.   1. Primary hypertension (Primary) Low sodium diet - CBC with Differential/Platelet - CMP14+EGFR - Lipid panel - lisinopril  (ZESTRIL ) 20 MG tablet; Take 1 tablet (20 mg total) by mouth daily.  Dispense: 90 tablet; Refill: 1  2. Anxiety Stress management - escitalopram  (LEXAPRO ) 10 MG tablet; Take 1 tablet (10 mg total) by mouth daily.  Dispense: 90 tablet; Refill: 1    The above assessment and management plan was discussed with the patient. The patient verbalized understanding of and has agreed to the management plan. Patient is aware to call the clinic if symptoms persist or worsen. Patient is aware when to return to the clinic for a follow-up visit. Patient educated on when it is appropriate to go to the emergency department.   Mary-Margaret Gladis, FNP

## 2024-05-28 ENCOUNTER — Encounter: Payer: Self-pay | Admitting: Nurse Practitioner
